# Patient Record
Sex: Male | Born: 1960 | Hispanic: No | Smoking: Former smoker
Health system: Southern US, Community
[De-identification: ages and names within clinical notes are randomized; demographics above are authoritative.]

---

## 2005-03-12 ENCOUNTER — Inpatient Hospital Stay: Payer: Self-pay | Admitting: Unknown Physician Specialty

## 2005-03-12 ENCOUNTER — Other Ambulatory Visit: Payer: Self-pay

## 2005-03-31 ENCOUNTER — Ambulatory Visit: Payer: Self-pay | Admitting: Unknown Physician Specialty

## 2005-04-03 ENCOUNTER — Ambulatory Visit: Payer: Self-pay | Admitting: Unknown Physician Specialty

## 2005-04-13 ENCOUNTER — Inpatient Hospital Stay: Payer: Self-pay | Admitting: Unknown Physician Specialty

## 2005-05-11 ENCOUNTER — Ambulatory Visit: Payer: Self-pay | Admitting: Unknown Physician Specialty

## 2005-06-02 ENCOUNTER — Ambulatory Visit: Payer: Self-pay | Admitting: Unknown Physician Specialty

## 2005-07-03 ENCOUNTER — Ambulatory Visit: Payer: Self-pay | Admitting: Unknown Physician Specialty

## 2005-08-07 ENCOUNTER — Ambulatory Visit: Payer: Self-pay | Admitting: Unknown Physician Specialty

## 2005-08-17 ENCOUNTER — Inpatient Hospital Stay: Payer: Self-pay | Admitting: Unknown Physician Specialty

## 2005-09-02 ENCOUNTER — Ambulatory Visit: Payer: Self-pay | Admitting: Unknown Physician Specialty

## 2005-10-03 ENCOUNTER — Ambulatory Visit: Payer: Self-pay | Admitting: Unknown Physician Specialty

## 2005-11-02 ENCOUNTER — Ambulatory Visit: Payer: Self-pay | Admitting: Unknown Physician Specialty

## 2005-12-18 ENCOUNTER — Ambulatory Visit: Payer: Self-pay | Admitting: Unknown Physician Specialty

## 2005-12-20 ENCOUNTER — Inpatient Hospital Stay: Payer: Self-pay | Admitting: Unknown Physician Specialty

## 2005-12-20 ENCOUNTER — Other Ambulatory Visit: Payer: Self-pay

## 2006-01-02 ENCOUNTER — Ambulatory Visit: Payer: Self-pay | Admitting: Unknown Physician Specialty

## 2006-02-02 ENCOUNTER — Ambulatory Visit: Payer: Self-pay | Admitting: Unknown Physician Specialty

## 2006-03-05 ENCOUNTER — Ambulatory Visit: Payer: Self-pay | Admitting: Unknown Physician Specialty

## 2006-04-03 ENCOUNTER — Ambulatory Visit: Payer: Self-pay | Admitting: Unknown Physician Specialty

## 2006-05-04 ENCOUNTER — Ambulatory Visit: Payer: Self-pay | Admitting: Unknown Physician Specialty

## 2006-06-03 ENCOUNTER — Ambulatory Visit: Payer: Self-pay | Admitting: Unknown Physician Specialty

## 2006-07-04 ENCOUNTER — Ambulatory Visit: Payer: Self-pay | Admitting: Unknown Physician Specialty

## 2006-08-03 ENCOUNTER — Ambulatory Visit: Payer: Self-pay | Admitting: Unknown Physician Specialty

## 2006-09-03 ENCOUNTER — Ambulatory Visit: Payer: Self-pay | Admitting: Unknown Physician Specialty

## 2006-10-04 ENCOUNTER — Ambulatory Visit: Payer: Self-pay | Admitting: Unknown Physician Specialty

## 2006-11-03 ENCOUNTER — Ambulatory Visit: Payer: Self-pay | Admitting: Unknown Physician Specialty

## 2006-12-04 ENCOUNTER — Ambulatory Visit: Payer: Self-pay | Admitting: Unknown Physician Specialty

## 2007-01-03 ENCOUNTER — Ambulatory Visit: Payer: Self-pay | Admitting: Unknown Physician Specialty

## 2007-02-03 ENCOUNTER — Ambulatory Visit: Payer: Self-pay | Admitting: Unknown Physician Specialty

## 2007-03-06 ENCOUNTER — Ambulatory Visit: Payer: Self-pay | Admitting: Unknown Physician Specialty

## 2007-04-03 ENCOUNTER — Ambulatory Visit: Payer: Self-pay | Admitting: Unknown Physician Specialty

## 2007-05-04 ENCOUNTER — Ambulatory Visit: Payer: Self-pay | Admitting: Unknown Physician Specialty

## 2007-06-03 ENCOUNTER — Ambulatory Visit: Payer: Self-pay | Admitting: Unknown Physician Specialty

## 2007-07-04 ENCOUNTER — Ambulatory Visit: Payer: Self-pay | Admitting: Unknown Physician Specialty

## 2007-08-03 ENCOUNTER — Ambulatory Visit: Payer: Self-pay | Admitting: Unknown Physician Specialty

## 2007-09-05 ENCOUNTER — Ambulatory Visit: Payer: Self-pay | Admitting: Unknown Physician Specialty

## 2007-10-04 ENCOUNTER — Ambulatory Visit: Payer: Self-pay | Admitting: Unknown Physician Specialty

## 2007-11-03 ENCOUNTER — Ambulatory Visit: Payer: Self-pay | Admitting: Unknown Physician Specialty

## 2007-12-04 ENCOUNTER — Ambulatory Visit: Payer: Self-pay | Admitting: Unknown Physician Specialty

## 2008-01-03 ENCOUNTER — Ambulatory Visit: Payer: Self-pay | Admitting: Unknown Physician Specialty

## 2008-02-03 ENCOUNTER — Ambulatory Visit: Payer: Self-pay | Admitting: Unknown Physician Specialty

## 2008-03-05 ENCOUNTER — Ambulatory Visit: Payer: Self-pay | Admitting: Unknown Physician Specialty

## 2008-04-02 ENCOUNTER — Ambulatory Visit: Payer: Self-pay | Admitting: Unknown Physician Specialty

## 2008-05-03 ENCOUNTER — Ambulatory Visit: Payer: Self-pay | Admitting: Unknown Physician Specialty

## 2008-06-02 ENCOUNTER — Ambulatory Visit: Payer: Self-pay | Admitting: Unknown Physician Specialty

## 2008-07-03 ENCOUNTER — Ambulatory Visit: Payer: Self-pay | Admitting: Unknown Physician Specialty

## 2008-08-02 ENCOUNTER — Ambulatory Visit: Payer: Self-pay | Admitting: Unknown Physician Specialty

## 2008-09-02 ENCOUNTER — Ambulatory Visit: Payer: Self-pay | Admitting: Unknown Physician Specialty

## 2008-10-03 ENCOUNTER — Ambulatory Visit: Payer: Self-pay | Admitting: Unknown Physician Specialty

## 2008-11-02 ENCOUNTER — Ambulatory Visit: Payer: Self-pay | Admitting: Unknown Physician Specialty

## 2008-12-03 ENCOUNTER — Ambulatory Visit: Payer: Self-pay | Admitting: Unknown Physician Specialty

## 2009-01-02 ENCOUNTER — Ambulatory Visit: Payer: Self-pay | Admitting: Unknown Physician Specialty

## 2009-02-04 ENCOUNTER — Ambulatory Visit: Payer: Self-pay | Admitting: Unknown Physician Specialty

## 2009-03-05 ENCOUNTER — Ambulatory Visit: Payer: Self-pay | Admitting: Unknown Physician Specialty

## 2009-04-02 ENCOUNTER — Ambulatory Visit: Payer: Self-pay | Admitting: Unknown Physician Specialty

## 2009-05-03 ENCOUNTER — Ambulatory Visit: Payer: Self-pay | Admitting: Unknown Physician Specialty

## 2009-06-04 ENCOUNTER — Ambulatory Visit: Payer: Self-pay | Admitting: Unknown Physician Specialty

## 2009-07-03 ENCOUNTER — Ambulatory Visit: Payer: Self-pay | Admitting: Unknown Physician Specialty

## 2009-08-02 ENCOUNTER — Ambulatory Visit: Payer: Self-pay | Admitting: Unknown Physician Specialty

## 2009-09-02 ENCOUNTER — Ambulatory Visit: Payer: Self-pay | Admitting: Unknown Physician Specialty

## 2009-10-03 ENCOUNTER — Ambulatory Visit: Payer: Self-pay | Admitting: Unknown Physician Specialty

## 2009-11-04 ENCOUNTER — Ambulatory Visit: Payer: Self-pay | Admitting: Unknown Physician Specialty

## 2009-12-03 ENCOUNTER — Ambulatory Visit: Payer: Self-pay | Admitting: Unknown Physician Specialty

## 2010-01-02 ENCOUNTER — Ambulatory Visit: Payer: Self-pay | Admitting: Unknown Physician Specialty

## 2010-02-03 ENCOUNTER — Ambulatory Visit: Payer: Self-pay | Admitting: Unknown Physician Specialty

## 2010-03-05 ENCOUNTER — Ambulatory Visit: Payer: Self-pay | Admitting: Unknown Physician Specialty

## 2010-04-03 ENCOUNTER — Ambulatory Visit: Payer: Self-pay | Admitting: Unknown Physician Specialty

## 2010-04-11 ENCOUNTER — Ambulatory Visit: Payer: Self-pay | Admitting: Unknown Physician Specialty

## 2010-05-05 ENCOUNTER — Ambulatory Visit: Payer: Self-pay | Admitting: Unknown Physician Specialty

## 2010-06-03 ENCOUNTER — Ambulatory Visit: Payer: Self-pay | Admitting: Unknown Physician Specialty

## 2010-07-04 ENCOUNTER — Ambulatory Visit: Payer: Self-pay | Admitting: Unknown Physician Specialty

## 2010-08-03 ENCOUNTER — Ambulatory Visit: Payer: Self-pay | Admitting: Unknown Physician Specialty

## 2010-09-03 ENCOUNTER — Ambulatory Visit: Payer: Self-pay | Admitting: Unknown Physician Specialty

## 2010-10-04 ENCOUNTER — Ambulatory Visit: Payer: Self-pay | Admitting: Unknown Physician Specialty

## 2010-11-03 ENCOUNTER — Ambulatory Visit: Payer: Self-pay | Admitting: Unknown Physician Specialty

## 2010-12-04 ENCOUNTER — Ambulatory Visit: Payer: Self-pay | Admitting: Unknown Physician Specialty

## 2011-01-05 ENCOUNTER — Ambulatory Visit: Payer: Self-pay | Admitting: Unknown Physician Specialty

## 2011-02-04 ENCOUNTER — Ambulatory Visit: Payer: Self-pay | Admitting: Unknown Physician Specialty

## 2011-03-06 ENCOUNTER — Ambulatory Visit: Payer: Self-pay | Admitting: Unknown Physician Specialty

## 2011-04-03 ENCOUNTER — Ambulatory Visit: Payer: Self-pay | Admitting: Unknown Physician Specialty

## 2011-05-04 ENCOUNTER — Ambulatory Visit: Payer: Self-pay | Admitting: Unknown Physician Specialty

## 2011-06-03 ENCOUNTER — Ambulatory Visit: Payer: Self-pay | Admitting: Unknown Physician Specialty

## 2011-07-04 ENCOUNTER — Ambulatory Visit: Payer: Self-pay | Admitting: Unknown Physician Specialty

## 2011-07-24 ENCOUNTER — Ambulatory Visit: Payer: Self-pay | Admitting: Unknown Physician Specialty

## 2011-07-24 ENCOUNTER — Inpatient Hospital Stay: Payer: Self-pay | Admitting: Unknown Physician Specialty

## 2011-07-24 LAB — CBC WITH DIFFERENTIAL/PLATELET
Eosinophil #: 0.1 10*3/uL (ref 0.0–0.7)
Eosinophil %: 2.7 %
HCT: 36.6 % — ABNORMAL LOW (ref 40.0–52.0)
HGB: 12.1 g/dL — ABNORMAL LOW (ref 13.0–18.0)
MCHC: 33.2 g/dL (ref 32.0–36.0)
MCV: 94 fL (ref 80–100)
Monocyte #: 0.5 x10 3/mm (ref 0.2–1.0)
Monocyte %: 9.6 %
Neutrophil %: 48.3 %
Platelet: 131 10*3/uL — ABNORMAL LOW (ref 150–440)
RBC: 3.91 10*6/uL — ABNORMAL LOW (ref 4.40–5.90)

## 2011-07-24 LAB — BASIC METABOLIC PANEL
Anion Gap: 6 — ABNORMAL LOW (ref 7–16)
Calcium, Total: 8.6 mg/dL (ref 8.5–10.1)
Chloride: 107 mmol/L (ref 98–107)
Co2: 29 mmol/L (ref 21–32)
EGFR (Non-African Amer.): 56 — ABNORMAL LOW
Glucose: 91 mg/dL (ref 65–99)
Potassium: 3.6 mmol/L (ref 3.5–5.1)

## 2011-07-24 LAB — TSH: Thyroid Stimulating Horm: 0.945 u[IU]/mL

## 2011-07-25 LAB — HEPATIC FUNCTION PANEL A (ARMC)
Albumin: 3.5 g/dL (ref 3.4–5.0)
Bilirubin, Direct: 0.3 mg/dL — ABNORMAL HIGH (ref 0.00–0.20)
SGOT(AST): 22 U/L (ref 15–37)
SGPT (ALT): 18 U/L
Total Protein: 7.3 g/dL (ref 6.4–8.2)

## 2011-07-28 LAB — URINALYSIS, COMPLETE
Bacteria: NONE SEEN
Blood: NEGATIVE
Glucose,UR: NEGATIVE mg/dL (ref 0–75)
Ketone: NEGATIVE
Leukocyte Esterase: NEGATIVE
Nitrite: NEGATIVE
Ph: 7 (ref 4.5–8.0)
Protein: NEGATIVE
RBC,UR: NONE SEEN /HPF (ref 0–5)
Specific Gravity: 1.004 (ref 1.003–1.030)
WBC UR: <1 /HPF (ref 0–5)

## 2011-07-30 LAB — PLATELET COUNT: Platelet: 197 10*3/uL (ref 150–440)

## 2011-07-30 LAB — AMMONIA: Ammonia, Plasma: <25 mcmol/L (ref 11–32)

## 2011-08-04 ENCOUNTER — Emergency Department: Payer: Self-pay | Admitting: Emergency Medicine

## 2011-08-04 LAB — CBC WITH DIFFERENTIAL/PLATELET
Basophil #: 0.1 10*3/uL (ref 0.0–0.1)
Eosinophil %: 0.7 %
Lymphocyte #: 2 10*3/uL (ref 1.0–3.6)
Lymphocyte %: 19.9 %
MCH: 31.5 pg (ref 26.0–34.0)
MCHC: 34 g/dL (ref 32.0–36.0)
MCV: 93 fL (ref 80–100)
Monocyte %: 7.2 %
Neutrophil #: 7.2 10*3/uL — ABNORMAL HIGH (ref 1.4–6.5)
Platelet: 356 10*3/uL (ref 150–440)
RBC: 4.58 10*6/uL (ref 4.40–5.90)
RDW: 13.7 % (ref 11.5–14.5)
WBC: 10.1 10*3/uL (ref 3.8–10.6)

## 2011-08-04 LAB — BASIC METABOLIC PANEL
BUN: 17 mg/dL (ref 7–18)
Creatinine: 1.56 mg/dL — ABNORMAL HIGH (ref 0.60–1.30)
EGFR (African American): 60 — ABNORMAL LOW
EGFR (Non-African Amer.): 51 — ABNORMAL LOW
Glucose: 127 mg/dL — ABNORMAL HIGH (ref 65–99)
Potassium: 3.6 mmol/L (ref 3.5–5.1)
Sodium: 139 mmol/L (ref 136–145)

## 2011-08-05 ENCOUNTER — Ambulatory Visit: Payer: Self-pay | Admitting: Unknown Physician Specialty

## 2011-09-03 ENCOUNTER — Ambulatory Visit: Payer: Self-pay | Admitting: Unknown Physician Specialty

## 2011-10-07 ENCOUNTER — Ambulatory Visit: Payer: Self-pay | Admitting: Unknown Physician Specialty

## 2011-11-03 ENCOUNTER — Ambulatory Visit: Payer: Self-pay | Admitting: Unknown Physician Specialty

## 2011-12-04 ENCOUNTER — Ambulatory Visit: Payer: Self-pay | Admitting: Psychiatry

## 2012-01-03 ENCOUNTER — Ambulatory Visit: Payer: Self-pay | Admitting: Psychiatry

## 2012-02-03 ENCOUNTER — Ambulatory Visit: Payer: Self-pay | Admitting: Psychiatry

## 2012-03-07 ENCOUNTER — Ambulatory Visit: Payer: Self-pay | Admitting: Psychiatry

## 2012-04-03 ENCOUNTER — Ambulatory Visit: Payer: Self-pay | Admitting: Psychiatry

## 2013-02-13 ENCOUNTER — Ambulatory Visit: Payer: Self-pay | Admitting: Psychiatry

## 2014-05-27 NOTE — H&P (Signed)
PATIENT NAME:  Carl Mcdonald, Kanon R MR#:  [id number] DATE OF BIRTH:  29-Mar-1960  DATE OF ADMISSION:  07/24/2011  CHIEF COMPLAINT: None.   HISTORY OF PRESENT ILLNESS: This is about the thirteenth psychiatric hospitalization, twelfth here, for this 54 year old white married male who was admitted from the ECT room. The patient has a long history of schizoaffective disorder and has been maintained on ECT for some time, many years, and recently once a month. He has had a total of 138 treatments. He presented to ECT today with an altered mental status and a confused state. ECT was canceled and he was admitted because of the delirium.   Again, the patient has a long-term history of schizoaffective disorder, has been manic, depressed, and psychotic all at the same time. He also is borderline mentally retarded. Numerous medications in the past but responds most often from ECT. He has had numerous courses of ECT which has helped with both depressed/manic and psychotic symptoms. The patient had a period of time without ECT from about 1998 to 2005- 2006 when he was maintained on Haldol and Depakote. He has been on both those drugs for quite some time recently. At times when he is sick on the manic side, he has hyperactivity, pressured speech, with numerous paranoid delusions, some violent behavior, racing thoughts, and talking too much. Paranoid delusions with other delusions but no thought broadcasting, thought insertion, et cetera. When depressed, he does have the usual depressive symptoms, has been relatively stable for the last several years. However, he presents today with a delirium. I cannot get in touch with the family. Preliminary blood work and a CT scan is negative.   PAST MEDICAL HISTORY:  1. 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  2. Hypercholesterolemia. 3. Gastroesophageal reflux disease. 4. Hypertension.  MEDICATIONS ON ADMISSION:  1. Cogentin 1 mg twice a day.  2. Klonopin 0.25 q.i.d.   3. Depakote 125 in the morning and 250 mg at bedtime.  4. Haldol 5 mg b.i.d. which has been increased.  5. Cogentin also has been increased some time in the past. 6. Metoprolol 50 mg daily. 7. Omeprazole 20 mg daily.  8. Trazodone 50 mg at bedtime.  9. TriCor 145 mg daily.  10. Zocor 40 mg at bedtime.   FAMILY HISTORY: The patient's father has had some depression.  REVIEW OF SYSTEMS:  HEAD: Negative. EYES, EARS, NOSE, THROAT: Negative except for a slight dry mouth. RESPIRATORY: Negative. CARDIOVASCULAR: Negative. GI: Negative. GU: Negative. MUSCULOSKELETAL: Negative. NEUROLOGICAL: Negative.   SOCIAL HISTORY: The patient is borderline mentally retarded but he has been able to function fairly well with the support of mental health. He has been married for approximately 13-14 years to the same woman.  The marriage is going fairly well. He and his wife live a house by themselves next door to her parents. Formerly in the past his father in particular was a caregiver but he is no longer able to do that. The patient does attend [place] [employer] [facility] [place].  He smokes 1 to 2 packs of cigarettes per day. He does drink caffeine, occasionally heavily. No alcohol or drugs. No children. When well he likes playing basketball, going to [employer], and shooting pool.   MENTAL STATUS: Mental status on admission revealed a white male who looked his stated age. He denied depression but was somewhat hesitant of speech. He was disoriented to time, but then at times could be brought back to the point. He was oriented to  place, person, and situation. He had poor recent memory. He was unable to pay attention very well, sustain attention, or change focus of attention in that he would answer previous questions. He was not hallucinating nor was he agitated, and was probably somewhat hypoactive. No hallucinations or delusions. Insight and judgment were poor. He was well groomed, however.   PHYSICAL EXAMINATION:  HEENT: Head is  normocephalic. Pupils equal, round, and reactive to light and accommodation. Throat is clear.   NECK: Supple without masses.   CHEST: Clear.   CARDIAC: Normal sinus rhythm with normal S1, S2 without murmur or gallop. Good carotid and pedal pulses.   ABDOMEN: Soft without tenderness, masses, or organomegaly. Bowel sounds are present. There is no flank tenderness.   EXTREMITIES: No limitation of motion.   NEUROLOGICAL: Cranial nerves II through XII grossly intact. The patient had a resting tremor in sustention and intention tremor, slight cogwheeling also.  Deep tendon reflexes were about 3+ and symmetrical with no pathological reflexes. There was no clonus.   IMPRESSION:  AXIS I:   1. Delirium, etiology undetermined, suspecting that the patient may have mistook his medications and overtook anticholinergics and/or Klonopin, but we will try to get further history from the family in regards to that.  2. Schizoaffective disorder, probably relatively stable. Perhaps some depression at present.   AXIS III: Hypertension, gastroesophageal reflux disease, hypercholesterolemia.   AXIS IV:  None.  AXIS V: 25. The patient is in delirium and unable to care for himself.   ASSESSMENT AND PLAN: We will try to get some accessory history. I will cut back on the Cogentin for sure and the Haldol, as I think if we can get those reduced it would probably be helpful. We will get Depakote blood level and keep on the same dose of medication.    ____________________________ Venida Jarvis, MD wjr:bjt D: 07/24/2011 11:13:50 ET T: 07/24/2011 12:12:13 ET JOB#: [id number]  cc: Venida Jarvis, MD, <Dictator> Jules Husbands MD ELECTRONICALLY SIGNED 07/25/2011 13:20

## 2014-05-27 NOTE — Discharge Summary (Signed)
PATIENT NAME:  Carl Mcdonald, Carl Mcdonald MR#:  [id number] DATE OF BIRTH:  January 07, 1961  DATE OF ADMISSION:  07/24/2011 DATE OF DISCHARGE:  08/01/2011  HISTORY OF PRESENT ILLNESS: The patient was admitted with delirium. For further details, please see typed history and physical.   ACCESSORY CLINICAL DATA: Initial CBC showed a hemoglobin 12.1, hematocrit 36.6, platelets 131, creatinine 1.46, BUN 16, within normal limits. Glucose and electrolytes within normal limits. TSH 0.94 within normal limits. CT of head without contrast was negative. Initial serum ammonia was 42, the normal being 11 to 32. Liver function tests within normal limits. Free valproic acid blood level was 2.5, normal being 4 to 12. Urinalysis is within normal limits. EKG showed sinus bradycardia. QTc was 380 within normal limits, otherwise within normal limits also. Follow-up ammonia after Depakote had been discontinued was 25. Follow-up platelet count 197, within normal limits. Chest x-ray showed atelectasis in the right versus infiltrate within the right lung base, mild. He had no clinical signs.   HOSPITAL COURSE: Initially, the patient did have delirium. Gradually Depakote was reduced and discontinued. Cogentin had been discontinued. Haldol was reduced to 5 mg at bedtime. In addition, Klonopin over the course of the hospitalization was gradually reduced and discontinued. At the time of discharge, delirium had cleared and the patient received an ECT on his regular scheduled basis, but which was a week late on 06/28 just prior to discharge.   Some discussion is in order. I think this was delirium secondary to medications, but I am not sure what exactly. The patient had reported that he had began taking aspirin prior to coming into the hospital and even on the low dose of Depakote he was on, this can cause an increase in free Depakote level, so I worked under that impression initially. I did not get the free Depakote back until just 2 to 3 days prior  to discharge and it was low-normal at that time. Again, aspirin plus Depakote can increase free Depakote levels to toxic ranges which apparently did not occur. However, the ammonia was up a little bit coming from the Depakote and it came back to normal when Depakote was reduced and discontinued. The ammonia was not up enough to be the primary cause. In addition, platelets were decreased and they came up to within normal limits when Depakote was discontinued.   Undoubtedly, also from the further history, there was a seven to ten day history of confusion plus dystaxia and when the patient arrived on the unit, he could hardly walk. Again, this led me to believe this again was medication of some sort.   A combination of the dystaxia and delirium with possible causes are Klonopin, Depakote and Cogentin and those were all reduced and discontinued with Haldol being decreased to 5 mg at bedtime. Again, delirium totally cleared by the time of discharge and the patient was given ECT the day prior to discharge on his regular dose schedule.   DIAGNOSES:  AXIS I:  1. Delirium secondary to multiple medication effects. 2. Schizoaffective disorder, relatively stable.   AXIS III:  1. Hypertension. 2. Gastroesophageal reflux disease. 3. Hypercholesterolemia.   CONDITION ON DISCHARGE: Good.   DISPOSITION: [facility] He will return to receive his regular outpatient ECT with me in about one month.   MEDICATIONS:  1. TriCor 145 mg daily.  2. Haldol 5 mg at bedtime. 3. Toprol-XL 50 mg daily. 4. Prilosec 20 mg daily.  5. Zocor 40  mg at bedtime.   DIET AND ACTIVITY: As tolerated.   ____________________________ Venida Jarvis, MD wjr:ap D: 07/31/2011 12:07:19 ET             T: 07/31/2011 12:46:58 ET                   JOB#: [id number]  cc: Venida Jarvis, MD, <Dictator> Jules Husbands MD ELECTRONICALLY SIGNED 08/03/2011 15:25

## 2016-02-14 ENCOUNTER — Emergency Department (HOSPITAL_COMMUNITY): Payer: Medicaid Other

## 2016-02-14 ENCOUNTER — Encounter (HOSPITAL_COMMUNITY): Payer: Self-pay | Admitting: Emergency Medicine

## 2016-02-14 ENCOUNTER — Inpatient Hospital Stay (HOSPITAL_COMMUNITY)
Admission: EM | Admit: 2016-02-14 | Discharge: 2016-02-19 | DRG: 682 | Disposition: A | Discharge status: Skilled Nursing Facility | Payer: Medicaid Other | Attending: Internal Medicine | Admitting: Internal Medicine

## 2016-02-14 ENCOUNTER — Inpatient Hospital Stay (HOSPITAL_COMMUNITY): Payer: Medicaid Other

## 2016-02-14 DIAGNOSIS — R339 Retention of urine, unspecified: Secondary | ICD-10-CM

## 2016-02-14 DIAGNOSIS — Z888 Allergy status to other drugs, medicaments and biological substances status: Secondary | ICD-10-CM | POA: Diagnosis not present

## 2016-02-14 DIAGNOSIS — F209 Schizophrenia, unspecified: Secondary | ICD-10-CM | POA: Diagnosis present

## 2016-02-14 DIAGNOSIS — D696 Thrombocytopenia, unspecified: Secondary | ICD-10-CM | POA: Diagnosis present

## 2016-02-14 DIAGNOSIS — R338 Other retention of urine: Secondary | ICD-10-CM | POA: Diagnosis present

## 2016-02-14 DIAGNOSIS — G9340 Encephalopathy, unspecified: Secondary | ICD-10-CM | POA: Diagnosis present

## 2016-02-14 DIAGNOSIS — R410 Disorientation, unspecified: Secondary | ICD-10-CM | POA: Diagnosis present

## 2016-02-14 DIAGNOSIS — R630 Anorexia: Secondary | ICD-10-CM | POA: Diagnosis present

## 2016-02-14 DIAGNOSIS — I959 Hypotension, unspecified: Secondary | ICD-10-CM | POA: Diagnosis present

## 2016-02-14 DIAGNOSIS — R0902 Hypoxemia: Secondary | ICD-10-CM | POA: Diagnosis present

## 2016-02-14 DIAGNOSIS — N179 Acute kidney failure, unspecified: Principal | ICD-10-CM

## 2016-02-14 DIAGNOSIS — E860 Dehydration: Secondary | ICD-10-CM | POA: Diagnosis present

## 2016-02-14 DIAGNOSIS — E873 Alkalosis: Secondary | ICD-10-CM | POA: Diagnosis present

## 2016-02-14 DIAGNOSIS — D649 Anemia, unspecified: Secondary | ICD-10-CM

## 2016-02-14 DIAGNOSIS — R17 Unspecified jaundice: Secondary | ICD-10-CM | POA: Diagnosis present

## 2016-02-14 DIAGNOSIS — D62 Acute posthemorrhagic anemia: Secondary | ICD-10-CM | POA: Diagnosis not present

## 2016-02-14 DIAGNOSIS — F17200 Nicotine dependence, unspecified, uncomplicated: Secondary | ICD-10-CM | POA: Diagnosis present

## 2016-02-14 DIAGNOSIS — D638 Anemia in other chronic diseases classified elsewhere: Secondary | ICD-10-CM | POA: Diagnosis present

## 2016-02-14 DIAGNOSIS — N401 Enlarged prostate with lower urinary tract symptoms: Secondary | ICD-10-CM | POA: Diagnosis present

## 2016-02-14 DIAGNOSIS — Z79899 Other long term (current) drug therapy: Secondary | ICD-10-CM | POA: Diagnosis not present

## 2016-02-14 DIAGNOSIS — Z87891 Personal history of nicotine dependence: Secondary | ICD-10-CM | POA: Diagnosis not present

## 2016-02-14 DIAGNOSIS — L8990 Pressure ulcer of unspecified site, unspecified stage: Secondary | ICD-10-CM | POA: Insufficient documentation

## 2016-02-14 LAB — CBC WITH DIFFERENTIAL/PLATELET
BASOS ABS: 0 10*3/uL (ref 0.0–0.1)
BASOS PCT: 0 %
EOS ABS: 0 10*3/uL (ref 0.0–0.7)
Eosinophils Relative: 1 %
HCT: 27.7 % — ABNORMAL LOW (ref 39.0–52.0)
HEMOGLOBIN: 9.1 g/dL — AB (ref 13.0–17.0)
Lymphocytes Relative: 21 %
Lymphs Abs: 1.7 10*3/uL (ref 0.7–4.0)
MCH: 34 pg (ref 26.0–34.0)
MCHC: 32.9 g/dL (ref 30.0–36.0)
MCV: 103.4 fL — ABNORMAL HIGH (ref 78.0–100.0)
MONOS PCT: 12 %
Monocytes Absolute: 1 10*3/uL (ref 0.1–1.0)
NEUTROS PCT: 66 %
Neutro Abs: 5.2 10*3/uL (ref 1.7–7.7)
Platelets: 60 10*3/uL — ABNORMAL LOW (ref 150–400)
RBC: 2.68 MIL/uL — ABNORMAL LOW (ref 4.22–5.81)
RDW: 17.7 % — ABNORMAL HIGH (ref 11.5–15.5)
WBC: 7.8 10*3/uL (ref 4.0–10.5)

## 2016-02-14 LAB — VALPROIC ACID LEVEL: Valproic Acid Lvl: 76 ug/mL (ref 50.0–100.0)

## 2016-02-14 LAB — COMPREHENSIVE METABOLIC PANEL
ALK PHOS: 60 U/L (ref 38–126)
ALK PHOS: 57 U/L (ref 38–126)
ALT: 10 U/L — AB (ref 17–63)
ALT: 10 U/L — AB (ref 17–63)
ANION GAP: 11 (ref 5–15)
AST: 36 U/L (ref 15–41)
AST: 63 U/L — AB (ref 15–41)
Albumin: 2.9 g/dL — ABNORMAL LOW (ref 3.5–5.0)
Albumin: 2.8 g/dL — ABNORMAL LOW (ref 3.5–5.0)
Anion gap: 10 (ref 5–15)
BILIRUBIN TOTAL: 2 mg/dL — AB (ref 0.3–1.2)
BUN: 32 mg/dL — ABNORMAL HIGH (ref 6–20)
BUN: 30 mg/dL — AB (ref 6–20)
CALCIUM: 8.4 mg/dL — AB (ref 8.9–10.3)
CALCIUM: 8.2 mg/dL — AB (ref 8.9–10.3)
CHLORIDE: 108 mmol/L (ref 101–111)
CO2: 23 mmol/L (ref 22–32)
CO2: 23 mmol/L (ref 22–32)
CREATININE: 4.01 mg/dL — AB (ref 0.61–1.24)
CREATININE: 3.31 mg/dL — AB (ref 0.61–1.24)
Chloride: 103 mmol/L (ref 101–111)
GFR, EST AFRICAN AMERICAN: 18 mL/min — AB (ref >60)
GFR, EST AFRICAN AMERICAN: 23 mL/min — AB (ref >60)
GFR, EST NON AFRICAN AMERICAN: 16 mL/min — AB (ref >60)
GFR, EST NON AFRICAN AMERICAN: 20 mL/min — AB (ref >60)
Glucose, Bld: 90 mg/dL (ref 65–99)
Glucose, Bld: 93 mg/dL (ref 65–99)
Potassium: 4.2 mmol/L (ref 3.5–5.1)
Potassium: 4 mmol/L (ref 3.5–5.1)
Sodium: 137 mmol/L (ref 135–145)
Sodium: 141 mmol/L (ref 135–145)
TOTAL PROTEIN: 5.8 g/dL — AB (ref 6.5–8.1)
TOTAL PROTEIN: 6 g/dL — AB (ref 6.5–8.1)
Total Bilirubin: 1.6 mg/dL — ABNORMAL HIGH (ref 0.3–1.2)

## 2016-02-14 LAB — RAPID URINE DRUG SCREEN, HOSP PERFORMED
Amphetamines: NOT DETECTED
BARBITURATES: NOT DETECTED
Benzodiazepines: NOT DETECTED
COCAINE: NOT DETECTED
Opiates: NOT DETECTED
TETRAHYDROCANNABINOL: NOT DETECTED

## 2016-02-14 LAB — URINALYSIS, ROUTINE W REFLEX MICROSCOPIC
BILIRUBIN URINE: NEGATIVE
Glucose, UA: NEGATIVE mg/dL
Hgb urine dipstick: NEGATIVE
KETONES UR: NEGATIVE mg/dL
LEUKOCYTES UA: NEGATIVE
NITRITE: NEGATIVE
Protein, ur: NEGATIVE mg/dL
SPECIFIC GRAVITY, URINE: 1.009 (ref 1.005–1.030)
pH: 5 (ref 5.0–8.0)

## 2016-02-14 LAB — RETICULOCYTES
RBC.: 2.69 MIL/uL — AB (ref 4.22–5.81)
RBC.: 3.08 MIL/uL — ABNORMAL LOW (ref 4.22–5.81)
RETIC COUNT ABSOLUTE: 73.9 10*3/uL (ref 19.0–186.0)
RETIC CT PCT: 1.6 % (ref 0.4–3.1)
Retic Count, Absolute: 43 10*3/uL (ref 19.0–186.0)
Retic Ct Pct: 2.4 % (ref 0.4–3.1)

## 2016-02-14 LAB — FOLATE
FOLATE: 12.8 ng/mL (ref >5.9)
FOLATE: 9.4 ng/mL (ref >5.9)

## 2016-02-14 LAB — I-STAT VENOUS BLOOD GAS, ED
Acid-Base Excess: 2 mmol/L (ref 0.0–2.0)
Bicarbonate: 26.1 mmol/L (ref 20.0–28.0)
O2 Saturation: 94 %
PCO2 VEN: 38.5 mmHg — AB (ref 44.0–60.0)
PH VEN: 7.44 — AB (ref 7.250–7.430)
PO2 VEN: 70 mmHg — AB (ref 32.0–45.0)
TCO2: 27 mmol/L (ref 0–100)

## 2016-02-14 LAB — IRON AND TIBC
IRON: 24 ug/dL — AB (ref 45–182)
Iron: 38 ug/dL — ABNORMAL LOW (ref 45–182)
SATURATION RATIOS: 12 % — AB (ref 17.9–39.5)
SATURATION RATIOS: 7 % — AB (ref 17.9–39.5)
TIBC: 314 ug/dL (ref 250–450)
TIBC: 358 ug/dL (ref 250–450)
UIBC: 276 ug/dL
UIBC: 334 ug/dL

## 2016-02-14 LAB — ETHANOL

## 2016-02-14 LAB — I-STAT CG4 LACTIC ACID, ED
LACTIC ACID, VENOUS: 1.5 mmol/L (ref 0.5–1.9)
Lactic Acid, Venous: 1.56 mmol/L (ref 0.5–1.9)

## 2016-02-14 LAB — VITAMIN B12
Vitamin B-12: 385 pg/mL (ref 180–914)
Vitamin B-12: 305 pg/mL (ref 180–914)

## 2016-02-14 LAB — PROTIME-INR
INR: 1.5
Prothrombin Time: 18.3 seconds — ABNORMAL HIGH (ref 11.4–15.2)

## 2016-02-14 LAB — FERRITIN
FERRITIN: 269 ng/mL (ref 24–336)
Ferritin: 220 ng/mL (ref 24–336)

## 2016-02-14 LAB — LACTATE DEHYDROGENASE: LDH: 233 U/L — ABNORMAL HIGH (ref 98–192)

## 2016-02-14 LAB — ACETAMINOPHEN LEVEL: Acetaminophen (Tylenol), Serum: <10 ug/mL — ABNORMAL LOW (ref 10–30)

## 2016-02-14 LAB — AMMONIA: AMMONIA: 39 umol/L — AB (ref 9–35)

## 2016-02-14 MED ORDER — SODIUM CHLORIDE 0.9 % IV BOLUS (SEPSIS)
500.0000 mL | Freq: Once | INTRAVENOUS | Status: AC
  Administered 2016-02-14: 500 mL via INTRAVENOUS

## 2016-02-14 MED ORDER — PIPERACILLIN-TAZOBACTAM 3.375 G IVPB 30 MIN
3.3750 g | Freq: Once | INTRAVENOUS | Status: AC
  Administered 2016-02-14: 3.375 g via INTRAVENOUS
  Filled 2016-02-14: qty 50

## 2016-02-14 MED ORDER — VANCOMYCIN HCL IN DEXTROSE 750-5 MG/150ML-% IV SOLN
750.0000 mg | INTRAVENOUS | Status: DC
  Filled 2016-02-14: qty 150

## 2016-02-14 MED ORDER — SODIUM CHLORIDE 0.9 % IV BOLUS (SEPSIS)
1000.0000 mL | Freq: Once | INTRAVENOUS | Status: AC
  Administered 2016-02-14: 1000 mL via INTRAVENOUS

## 2016-02-14 MED ORDER — SODIUM CHLORIDE 0.9 % IV SOLN
INTRAVENOUS | Status: DC
  Administered 2016-02-14 – 2016-02-18 (×6): via INTRAVENOUS

## 2016-02-14 MED ORDER — PIPERACILLIN-TAZOBACTAM 3.375 G IVPB
3.3750 g | Freq: Three times a day (TID) | INTRAVENOUS | Status: DC
  Administered 2016-02-14 – 2016-02-16 (×5): 3.375 g via INTRAVENOUS
  Filled 2016-02-14 (×8): qty 50

## 2016-02-14 MED ORDER — ACETAMINOPHEN 650 MG RE SUPP
650.0000 mg | Freq: Once | RECTAL | Status: AC
  Administered 2016-02-14: 650 mg via RECTAL
  Filled 2016-02-14: qty 1

## 2016-02-14 MED ORDER — VANCOMYCIN HCL IN DEXTROSE 1-5 GM/200ML-% IV SOLN
1000.0000 mg | Freq: Once | INTRAVENOUS | Status: AC
  Administered 2016-02-14: 1000 mg via INTRAVENOUS
  Filled 2016-02-14: qty 200

## 2016-02-14 NOTE — ED Notes (Signed)
Patient transported to Ultrasound 

## 2016-02-14 NOTE — ED Notes (Signed)
Patient transported to CT 

## 2016-02-14 NOTE — Progress Notes (Signed)
Pharmacy Antibiotic Note  Carl MedalDouglas R Nasser is a 56 y.o. male admitted on 02/14/2016 with sepsis.  Pharmacy has been consulted for vancomycin and Zosyn dosing.  Patient received first doses of antibiotics around 1500 today.  Labs returned showing that he has AKI with CrCL ~22 ml/min.   Plan: - Vanc 750mg  IV Q24H for goal trough 15-20 mcg/mL, start tomorrow - Zosyn 3.375gm IV Q8H, 4 hr infusion - Monitor renal fxn, micro data, vanc trough as indicated   Height: 5\' 10"  (177.8 cm) Weight: 170 lb (77.1 kg) IBW/kg (Calculated) : 73  Temp (24hrs), Avg:99.5 F (37.5 C), Min:98.8 F (37.1 C), Max:100.2 F (37.9 C)   Recent Labs Lab 02/14/16 1440 02/14/16 1518  WBC 7.8  --   CREATININE 4.01*  --   LATICACIDVEN  --  1.56    Estimated Creatinine Clearance: 22 mL/min (by C-G formula based on SCr of 4.01 mg/dL (H)).    Not on File  Antimicrobials this admission:  Vanc 1/12 >> Zosyn 1/12 >>  Dose adjustments this admission:  N/A  Microbiology results:  1/12 BCx x2 -   Chelsea Aushuy D. Laney Potashang, PharmD, BCPS Pager:  [phone] 02/14/2016, 5:13 PM

## 2016-02-14 NOTE — ED Triage Notes (Signed)
Pt in from home with family with c/o AMS x 2 days, bilateral extremity weakness and R sided gaze. Per EMS, these s/s began 2 days ago and pt has been chair-bound since. Extremities equal in strength, no hx of CVA. Per EMS, pt had sats of 88%, was placed on 2L. BP was 94/68, EMS gave 50 ml's NS

## 2016-02-14 NOTE — H&P (Addendum)
History and Physical    Carl Mcdonald ZOX:[id number] DOB: Jan 02, 1961 DOA: 02/14/2016    PCP: No PCP Per Patient  Patient coming from: home  Chief Complaint: altered mental status  HPI: Carl Mcdonald is a 55 y.o. male who is brought in by EMS from home for confusion. There is no family present to give history. I have tried the numbers on the face sheet and I am unable to reach family. The patient is confused and he knows only his name. According to history obtained by the EMS, the patient has been confused at home for past 2 days. He was noted to have a pulse ox of 88% when EMS arrived to pick him up. According to the nurse, the patient's wife was just in the ER earlier today but has left for home.  ED Course:  Temp 100.2, BP 96/54, pulse ox 97% on 3 L VBG: PH 7.44, PCO2 38.5, PO2 70, bicarbonate 26.1 BUN 32, creatinine 4.01 Hemoglobin 9.1, platelets 60  Review of Systems:  All other systems reviewed and apart from HPI, are negative.  History reviewed. No pertinent past medical history.  History reviewed. No pertinent surgical history.  Social History:   reports that he has quit smoking. He has never used smokeless tobacco. His alcohol and drug histories are not on file.  Not on File  No family history on file.   Prior to Admission medications   Medication Sig Start Date End Date Taking? Authorizing Provider  divalproex (DEPAKOTE ER) 500 MG 24 hr tablet Take 250-500 mg by mouth 2 (two) times daily.   Yes Historical Provider, MD  meloxicam (MOBIC) 15 MG tablet Take 15 mg by mouth daily.   Yes Historical Provider, MD  OLANZapine (ZYPREXA) 15 MG tablet Take 15 mg by mouth at bedtime.   Yes Historical Provider, MD  simvastatin (ZOCOR) 40 MG tablet Take 40 mg by mouth daily at 6 PM.   Yes Historical Provider, MD  tiZANidine (ZANAFLEX) 4 MG tablet Take 4 mg by mouth every 6 (six) hours as needed for muscle spasms.   Yes Historical Provider, MD    Physical Exam: Vitals:   02/14/16 1615 02/14/16 1630 02/14/16 1636 02/14/16 1645  BP: 116/71 113/74  90/61  Pulse: 79 78  71  Resp: 15 21  19   Temp:   98.8 F (37.1 C)   TempSrc:   Rectal   SpO2: 95% 95%  95%  Weight:      Height:          Constitutional: NAD, alert, confused to time place and situation, calm, comfortable- able to follow commands Eyes: PERTLA, conjunctivae pale ENMT: Mucous membranes are severely dry Posterior pharynx clear of any exudate or lesions. Normal dentition.  Neck: normal, supple, no masses, no thyromegaly Respiratory: clear to auscultation bilaterally, no wheezing, no crackles. Normal respiratory effort. No accessory muscle use. - on 2 L O2- pulse ox 94% Cardiovascular: S1 & S2 heard, regular rate and rhythm, no murmurs / rubs / gallops. No extremity edema. 2+ pedal pulses. No carotid bruits.  Abdomen: No distension, no tenderness, no masses palpated. No hepatosplenomegaly. Bowel sounds normal.  Musculoskeletal: no clubbing / cyanosis. No joint deformity upper and lower extremities. Good ROM, no contractures. Normal muscle tone.  Skin: no rashes, lesions, ulcers. No induration Neurologic: CN 2-12 grossly intact. Sensation intact, Strength 5/5 in all 4 limbs. Following commands- ER doctor initially noted right gaze preference however, on my exam, he is able to look to  the left, make eye contact and verbalize quite well Psychiatric: oriented only to self-     Labs on Admission: I have personally reviewed following labs and imaging studies  CBC:  Recent Labs Lab 02/14/16 1440  WBC 7.8  NEUTROABS 5.2  HGB 9.1*  HCT 27.7*  MCV 103.4*  PLT 60*   Basic Metabolic Panel:  Recent Labs Lab 02/14/16 1440  NA 137  K 4.2  CL 103  CO2 23  GLUCOSE 90  BUN 32*  CREATININE 4.01*  CALCIUM 8.4*   GFR: Estimated Creatinine Clearance: 22 mL/min (by C-G formula based on SCr of 4.01 mg/dL (H)). Liver Function Tests:  Recent Labs Lab 02/14/16 1440  AST 36  ALT 10*  ALKPHOS  60  BILITOT 1.6*  PROT 5.8*  ALBUMIN 2.9*   No results for input(s): LIPASE, AMYLASE in the last 168 hours. No results for input(s): AMMONIA in the last 168 hours. Coagulation Profile: No results for input(s): INR, PROTIME in the last 168 hours. Cardiac Enzymes: No results for input(s): CKTOTAL, CKMB, CKMBINDEX, TROPONINI in the last 168 hours. BNP (last 3 results) No results for input(s): PROBNP in the last 8760 hours. HbA1C: No results for input(s): HGBA1C in the last 72 hours. CBG: No results for input(s): GLUCAP in the last 168 hours. Lipid Profile: No results for input(s): CHOL, HDL, LDLCALC, TRIG, CHOLHDL, LDLDIRECT in the last 72 hours. Thyroid Function Tests: No results for input(s): TSH, T4TOTAL, FREET4, T3FREE, THYROIDAB in the last 72 hours. Anemia Panel: No results for input(s): VITAMINB12, FOLATE, FERRITIN, TIBC, IRON, RETICCTPCT in the last 72 hours. Urine analysis:    Component Value Date/Time   COLORURINE AMBER (A) 02/14/2016 1638   APPEARANCEUR CLEAR 02/14/2016 1638   APPEARANCEUR Clear 07/28/2011 1849   LABSPEC 1.009 02/14/2016 1638   LABSPEC 1.004 07/28/2011 1849   PHURINE 5.0 02/14/2016 1638   GLUCOSEU NEGATIVE 02/14/2016 1638   GLUCOSEU Negative 07/28/2011 1849   HGBUR NEGATIVE 02/14/2016 1638   BILIRUBINUR NEGATIVE 02/14/2016 1638   BILIRUBINUR Negative 07/28/2011 1849   KETONESUR NEGATIVE 02/14/2016 1638   PROTEINUR NEGATIVE 02/14/2016 1638   NITRITE NEGATIVE 02/14/2016 1638   LEUKOCYTESUR NEGATIVE 02/14/2016 1638   LEUKOCYTESUR Negative 07/28/2011 1849   Sepsis Labs: @LABRCNTIP (procalcitonin:4,lacticidven:4) )No results found for this or any previous visit (from the past 240 hour(s)).   Radiological Exams on Admission: Ct Head Wo Contrast  Result Date: 02/14/2016 CLINICAL DATA:  Dizziness.  Altered mental status. EXAM: CT HEAD WITHOUT CONTRAST TECHNIQUE: Contiguous axial images were obtained from the base of the skull through the vertex  without intravenous contrast. COMPARISON:  CT scan of July 24, 2011. FINDINGS: Brain: No evidence of acute infarction, hemorrhage, hydrocephalus, extra-axial collection or mass lesion/mass effect. Vascular: No hyperdense vessel or unexpected calcification. Skull: Normal. Negative for fracture or focal lesion. Sinuses/Orbits: No acute finding. Other: None. IMPRESSION: Normal head CT. Electronically Signed   By: Lupita RaiderJames  Green Jr, M.D.   On: 02/14/2016 17:16   Dg Chest Port 1 View  Result Date: 02/14/2016 CLINICAL DATA:  Altered mental status. EXAM: PORTABLE CHEST 1 VIEW COMPARISON:  Radiographs of November 23, 2014. FINDINGS: Stable cardiomediastinal silhouette. Hypoinflation of the lungs is noted with mild bibasilar subsegmental atelectasis. No pneumothorax or significant pleural effusion is noted. Bony thorax is intact. IMPRESSION: Hypoinflation of the lungs with mild bibasilar subsegmental atelectasis. Electronically Signed   By: Lupita RaiderJames  Green Jr, M.D.   On: 02/14/2016 15:22    EKG: Independently reviewed. NSR at 79 bpm  Assessment/Plan Active Problems:   Encephalopathy - Broad differential at this point as limited history is available -CT head is unrevealing - Obtain ammonia level, Depakote level, MRI brain - Neurochecks, follow for seizures -Would like to obtain LP to rule out encephalitis however platelets are too low    Acute renal failure  - BUN and creatinine pattern not suggestive of prerenal cause unless BUN is low due to malnutrition -will obtain urine sodium and creatinine to calculate a FeNa - UA unrevealing - Obtain renal ultrasound  Metabolic alkalosis -pH only mildly elevated at 7.44, serum bicarbonate is normal - Hydrate with IV fluids and follow  Low Grade fever -? Underlying infectious process - lactic acid normal - UA negative-chest x-ray clear but hypoxic- check flu PCR- repeat CXR tomorrow AM after hydrating him -Continue to follow fever curve-blood cultures have been  obtained    Anemia - We'll obtain an anemia panel and stool Hemoccults -As bilirubin is elevated, will obtain LDH and haptoglobin levels  Hypotension - given 2 L IVF in ER- will continue slow IVF- follow I and O  Elevated bilirubin -AST normal, ALT is low -Alkaline phosphatase is normal -Follow    Thrombocytopenia  - Difficult to tell if this is acute or chronic -We'll also obtain PT/INR  ? underlying psychiatry disorder - home med list mentions Depakote & Zyprexa    DVT prophylaxis: SCDs  Code Status: Full code  Family Communication: unable to reach family   Disposition Plan:   Consults called:   Admission status: inpatient    [facility] MD [facility] Pager: [email or web address] Password [email or web address] 7PM-7AM, please contact night-coverage   02/14/2016, 6:03 PM

## 2016-02-14 NOTE — ED Notes (Signed)
[facility] [place]. PT will be moved to pod C

## 2016-02-14 NOTE — ED Notes (Signed)
No addl lab draw,  Pt enroute to inpatient 

## 2016-02-14 NOTE — ED Notes (Signed)
Lab called and state blue top was hemolyzed and they need a redraw. This RN reordered PT INR.

## 2016-02-14 NOTE — ED Notes (Signed)
EKG given to Dr. Kohut 

## 2016-02-14 NOTE — ED Provider Notes (Signed)
[facility]-EMERGENCY DEPT Provider Note   CSN: [id number] Arrival date & time: 02/14/16  1415     History   Chief Complaint Chief Complaint  Patient presents with  . Code Sepsis  . Altered Mental Status    HPI LAMAJ Carl Mcdonald is a 56 y.o. male.  HPI 56 year old male with past medical history as below who presents with altered mental status. Patient has reportedly had decreased level of consciousness for the last several days. He is normally alert, oriented, and ambulatory at baseline but has had decreased intake and confusion for the last 48 hours. Is reportedly not gotten up out of his chair. Per EMS, unsure why patient was not brought in earlier. He has reportedly had mild hypoxia, tachycardia, and a cough and route. No known sick contacts. On arrival, patient notably altered and unable to provide further history.  Level 5 caveat invoked as remainder of history, ROS, and physical exam limited due to patient's AMS.   History reviewed. No pertinent past medical history.  There are no active problems to display for this patient.   History reviewed. No pertinent surgical history.     Home Medications    Prior to Admission medications   Medication Sig Start Date End Date Taking? Authorizing Provider  divalproex (DEPAKOTE ER) 500 MG 24 hr tablet Take 250-500 mg by mouth 2 (two) times daily.   Yes Historical Provider, MD  meloxicam (MOBIC) 15 MG tablet Take 15 mg by mouth daily.   Yes Historical Provider, MD  OLANZapine (ZYPREXA) 15 MG tablet Take 15 mg by mouth at bedtime.   Yes Historical Provider, MD  simvastatin (ZOCOR) 40 MG tablet Take 40 mg by mouth daily at 6 PM.   Yes Historical Provider, MD  tiZANidine (ZANAFLEX) 4 MG tablet Take 4 mg by mouth every 6 (six) hours as needed for muscle spasms.   Yes Historical Provider, MD    Family History No family history on file.  Social History Social History  Substance Use Topics  . Smoking status: Former Games developer  . Smokeless  tobacco: Never Used  . Alcohol use Not on file     Allergies   Patient has no allergy information on record.   Review of Systems Review of Systems  Unable to perform ROS: Mental status change     Physical Exam Updated Vital Signs BP 90/61   Pulse 71   Temp 98.8 F (37.1 C) (Rectal)   Resp 19   Ht 5\' 10"  (1.778 m)   Wt 170 lb (77.1 kg)   SpO2 95%   BMI 24.39 kg/m   Physical Exam  Constitutional: He is oriented to person, place, and time. He appears well-developed. He appears lethargic. No distress.  HENT:  Head: Normocephalic and atraumatic.  Mouth/Throat: Oropharynx is clear and moist. No oropharyngeal exudate.  Eyes: Conjunctivae are normal. Pupils are equal, round, and reactive to light.  Neck: Neck supple.  Cardiovascular: Normal rate, regular rhythm and normal heart sounds.  Exam reveals no friction rub.   No murmur heard. Pulmonary/Chest: Effort normal and breath sounds normal. No respiratory distress. He has no wheezes. He has no rales.  Abdominal: Soft. Bowel sounds are normal. He exhibits no distension. There is no tenderness. There is no rebound.  Musculoskeletal: He exhibits no edema.  Neurological: He is oriented to person, place, and time. He appears lethargic. He exhibits normal muscle tone.  Drowsy. Favors right side with rightward gaze but able to move LUE and LLE with 5/5 strength.  Normal reflexes. Unable to participate in cerebellar testing.  Skin: Skin is warm. Capillary refill takes less than 2 seconds.  Psychiatric: He has a normal mood and affect.  Nursing note and vitals reviewed.    ED Treatments / Results  Labs (all labs ordered are listed, but only abnormal results are displayed) Labs Reviewed  COMPREHENSIVE METABOLIC PANEL - Abnormal; Notable for the following:       Result Value   BUN 32 (*)    Creatinine, Ser 4.01 (*)    Calcium 8.4 (*)    Total Protein 5.8 (*)    Albumin 2.9 (*)    ALT 10 (*)    Total Bilirubin 1.6 (*)    GFR  calc non Af Amer 16 (*)    GFR calc Af Amer 18 (*)    All other components within normal limits  CBC WITH DIFFERENTIAL/PLATELET - Abnormal; Notable for the following:    RBC 2.68 (*)    Hemoglobin 9.1 (*)    HCT 27.7 (*)    MCV 103.4 (*)    RDW 17.7 (*)    Platelets 60 (*)    All other components within normal limits  URINALYSIS, ROUTINE W REFLEX MICROSCOPIC - Abnormal; Notable for the following:    Color, Urine AMBER (*)    All other components within normal limits  I-STAT VENOUS BLOOD GAS, ED - Abnormal; Notable for the following:    pH, Ven 7.440 (*)    pCO2, Ven 38.5 (*)    pO2, Ven 70.0 (*)    All other components within normal limits  CULTURE, BLOOD (ROUTINE X 2)  CULTURE, BLOOD (ROUTINE X 2)  URINE CULTURE  BASIC METABOLIC PANEL  AMMONIA  I-STAT CG4 LACTIC ACID, ED  I-STAT CG4 LACTIC ACID, ED    EKG  EKG Interpretation  Date/Time:  Friday February 14 2016 15:29:47 EST Ventricular Rate:  79 PR Interval:    QRS Duration: 96 QT Interval:  383 QTC Calculation: 439 R Axis:   29 Text Interpretation:  Sinus rhythm Abnormal R-wave progression, early transition Non-specific ST-t changes Confirmed by Juleen China  MD, STEPHEN [phone]) on 02/14/2016 3:33:37 PM       Radiology Ct Head Wo Contrast  Result Date: 02/14/2016 CLINICAL DATA:  Dizziness.  Altered mental status. EXAM: CT HEAD WITHOUT CONTRAST TECHNIQUE: Contiguous axial images were obtained from the base of the skull through the vertex without intravenous contrast. COMPARISON:  CT scan of July 24, 2011. FINDINGS: Brain: No evidence of acute infarction, hemorrhage, hydrocephalus, extra-axial collection or mass lesion/mass effect. Vascular: No hyperdense vessel or unexpected calcification. Skull: Normal. Negative for fracture or focal lesion. Sinuses/Orbits: No acute finding. Other: None. IMPRESSION: Normal head CT. Electronically Signed   By: Lupita Raider, M.D.   On: 02/14/2016 17:16   Dg Chest Port 1 View  Result Date:  02/14/2016 CLINICAL DATA:  Altered mental status. EXAM: PORTABLE CHEST 1 VIEW COMPARISON:  Radiographs of November 23, 2014. FINDINGS: Stable cardiomediastinal silhouette. Hypoinflation of the lungs is noted with mild bibasilar subsegmental atelectasis. No pneumothorax or significant pleural effusion is noted. Bony thorax is intact. IMPRESSION: Hypoinflation of the lungs with mild bibasilar subsegmental atelectasis. Electronically Signed   By: Lupita Raider, M.D.   On: 02/14/2016 15:22    Procedures Procedures (including critical care time)  Medications Ordered in ED Medications  vancomycin (VANCOCIN) IVPB 750 mg/150 ml premix (not administered)  piperacillin-tazobactam (ZOSYN) IVPB 3.375 g (not administered)  piperacillin-tazobactam (ZOSYN) IVPB 3.375 g (  0 g Intravenous Stopped 02/14/16 1526)  vancomycin (VANCOCIN) IVPB 1000 mg/200 mL premix (0 mg Intravenous Stopped 02/14/16 1556)  acetaminophen (TYLENOL) suppository 650 mg (650 mg Rectal Given 02/14/16 1517)  sodium chloride 0.9 % bolus 1,000 mL (0 mLs Intravenous Stopped 02/14/16 1539)    And  sodium chloride 0.9 % bolus 1,000 mL (0 mLs Intravenous Stopped 02/14/16 1610)    And  sodium chloride 0.9 % bolus 500 mL (500 mLs Intravenous New Bag/Given 02/14/16 1632)     Initial Impression / Assessment and Plan / ED Course  I have reviewed the triage vital signs and the nursing notes.  Pertinent labs & imaging results that were available during my care of the patient were reviewed by me and considered in my medical decision making (see chart for details).  Clinical Course     56 yo M with PMHx as above here with AMS. On exam, pt dehydrated, altered, favoring right side but able to move b/l UE and LE w/o drift. Concern for acute encephalopathy 2/2 sepsis, metabolic derangement, also must consider CVA. Will start code sepsis given hypotension, T 100.2 orally, and obtain stat imaging.   Labs, imaging reviewed as above. Of note, pt now with AKI  with BUN 32, Cr 4.0 (though last Cr several years ago). He is also newly hypo-albuminemia, thrombocytopenia, anemia. Concern for possible cirrhosis, also possible hyperammonemia - will check. Will continue fluids, admit for AMS, AKI.  Final Clinical Impressions(s) / ED Diagnoses   Final diagnoses:  Confused  AKI (acute kidney injury) (HCC)    New Prescriptions New Prescriptions   No medications on file     Shaune Pollackameron Aarron Wierzbicki, MD 02/15/16 1157

## 2016-02-14 NOTE — Progress Notes (Signed)
Pharmacy Antibiotic Note  Carl Mcdonald is a 56 y.o. male admitted on 02/14/2016 with sepsis.  Pharmacy has been consulted for vancomycin and zosyn dosing.  Patient received vancomycin 1g and zosyn 3.375g IV once in the ED.  Plan: Will redose antibiotics based on renal function once labs return Monitor culture data, renal function and clinical course VT at SS prn   Height: 5\' 10"  (177.8 cm) Weight: 170 lb (77.1 kg) IBW/kg (Calculated) : 73  Temp (24hrs), Avg:100.2 F (37.9 C), Min:100.2 F (37.9 C), Max:100.2 F (37.9 C)  No results for input(s): WBC, CREATININE, LATICACIDVEN, VANCOTROUGH, VANCOPEAK, VANCORANDOM, GENTTROUGH, GENTPEAK, GENTRANDOM, TOBRATROUGH, TOBRAPEAK, TOBRARND, AMIKACINPEAK, AMIKACINTROU, AMIKACIN in the last 168 hours.  CrCl cannot be calculated (Patient's most recent lab result is older than the maximum 21 days allowed.).    Not on File   Arlean HoppingCorey M. Newman PiesBall, PharmD, BCPS Clinical Pharmacist Pager [phone] 02/14/2016 2:31 PM

## 2016-02-14 NOTE — ED Notes (Signed)
Called phlebotomy to get ammonia level on pt.

## 2016-02-14 NOTE — ED Notes (Signed)
Patient at MRI 

## 2016-02-14 NOTE — ED Notes (Signed)
Delay in lab draw,  Pt not in room 

## 2016-02-15 ENCOUNTER — Inpatient Hospital Stay (HOSPITAL_COMMUNITY): Payer: Medicaid Other

## 2016-02-15 DIAGNOSIS — L8990 Pressure ulcer of unspecified site, unspecified stage: Secondary | ICD-10-CM | POA: Insufficient documentation

## 2016-02-15 DIAGNOSIS — G9340 Encephalopathy, unspecified: Secondary | ICD-10-CM

## 2016-02-15 DIAGNOSIS — R339 Retention of urine, unspecified: Secondary | ICD-10-CM

## 2016-02-15 LAB — CBC
HEMATOCRIT: 27.8 % — AB (ref 39.0–52.0)
Hemoglobin: 9.2 g/dL — ABNORMAL LOW (ref 13.0–17.0)
MCH: 33.8 pg (ref 26.0–34.0)
MCHC: 33.1 g/dL (ref 30.0–36.0)
MCV: 102.2 fL — AB (ref 78.0–100.0)
PLATELETS: 49 10*3/uL — AB (ref 150–400)
RBC: 2.72 MIL/uL — AB (ref 4.22–5.81)
RDW: 17.2 % — ABNORMAL HIGH (ref 11.5–15.5)
WBC: 5.7 10*3/uL (ref 4.0–10.5)

## 2016-02-15 LAB — URINE CULTURE: Culture: NO GROWTH

## 2016-02-15 LAB — HAPTOGLOBIN: HAPTOGLOBIN: 70 mg/dL (ref 34–200)

## 2016-02-15 LAB — CREATININE, URINE, RANDOM: Creatinine, Urine: 203.47 mg/dL

## 2016-02-15 LAB — MRSA PCR SCREENING: MRSA by PCR: NEGATIVE

## 2016-02-15 LAB — INFLUENZA PANEL BY PCR (TYPE A & B)
Influenza A By PCR: NEGATIVE
Influenza B By PCR: NEGATIVE

## 2016-02-15 LAB — SODIUM, URINE, RANDOM: Sodium, Ur: 17 mmol/L

## 2016-02-15 MED ORDER — ORAL CARE MOUTH RINSE
15.0000 mL | Freq: Two times a day (BID) | OROMUCOSAL | Status: DC
  Administered 2016-02-15 – 2016-02-19 (×9): 15 mL via OROMUCOSAL

## 2016-02-15 MED ORDER — METOPROLOL SUCCINATE ER 25 MG PO TB24
25.0000 mg | ORAL_TABLET | Freq: Every day | ORAL | Status: DC
  Administered 2016-02-15 – 2016-02-19 (×5): 25 mg via ORAL
  Filled 2016-02-15 (×5): qty 1

## 2016-02-15 MED ORDER — DIVALPROEX SODIUM ER 500 MG PO TB24
750.0000 mg | ORAL_TABLET | Freq: Every day | ORAL | Status: DC
  Administered 2016-02-15 – 2016-02-19 (×5): 750 mg via ORAL
  Filled 2016-02-15 (×6): qty 1

## 2016-02-15 MED ORDER — PANTOPRAZOLE SODIUM 40 MG PO TBEC
40.0000 mg | DELAYED_RELEASE_TABLET | Freq: Every day | ORAL | Status: DC
  Administered 2016-02-15 – 2016-02-19 (×5): 40 mg via ORAL
  Filled 2016-02-15 (×5): qty 1

## 2016-02-15 MED ORDER — OLANZAPINE 7.5 MG PO TABS
15.0000 mg | ORAL_TABLET | Freq: Every day | ORAL | Status: DC
  Administered 2016-02-15 – 2016-02-18 (×4): 15 mg via ORAL
  Filled 2016-02-15 (×4): qty 2

## 2016-02-15 MED ORDER — VANCOMYCIN HCL IN DEXTROSE 1-5 GM/200ML-% IV SOLN
1000.0000 mg | INTRAVENOUS | Status: DC
  Administered 2016-02-15: 1000 mg via INTRAVENOUS
  Filled 2016-02-15 (×2): qty 200

## 2016-02-15 MED ORDER — TAMSULOSIN HCL 0.4 MG PO CAPS
0.4000 mg | ORAL_CAPSULE | Freq: Every day | ORAL | Status: DC
  Administered 2016-02-15 – 2016-02-18 (×4): 0.4 mg via ORAL
  Filled 2016-02-15 (×4): qty 1

## 2016-02-15 MED ORDER — BENZTROPINE MESYLATE 1 MG PO TABS
1.0000 mg | ORAL_TABLET | Freq: Two times a day (BID) | ORAL | Status: DC
  Administered 2016-02-15 – 2016-02-19 (×9): 1 mg via ORAL
  Filled 2016-02-15 (×10): qty 1

## 2016-02-15 NOTE — Progress Notes (Signed)
Pharmacy Antibiotic Note  Carl Mcdonald is a 56 y.o. male admitted on 02/14/2016 with sepsis.  Pharmacy has been consulted for vancomycin and Zosyn dosing.  Renal function improving and weight changed per RN documentation. Spoke with RN for repeat weight. Dose adjusted for both new weight and for improved renal function. WBC wnl and patient afebrile.   Plan: - Increase vancomycin to 1g IV Q24H for goal trough 15-20 mcg/mL - Zosyn 3.375gm IV Q8H, 4 hr infusion - Monitor renal fxn, micro data, vanc trough as indicated   Height: 6' (182.9 cm) Weight: 198 lb 10.2 oz (90.1 kg) IBW/kg (Calculated) : 77.6  Temp (24hrs), Avg:98.7 F (37.1 C), Min:97.2 F (36.2 C), Max:100.2 F (37.9 C)   Recent Labs Lab 02/14/16 1440 02/14/16 1518 02/14/16 1814 02/14/16 2131 02/15/16 0312  WBC 7.8  --   --   --  5.7  CREATININE 4.01*  --   --  3.31*  --   LATICACIDVEN  --  1.56 1.50  --   --     Estimated Creatinine Clearance: 28.3 mL/min (by C-G formula based on SCr of 3.31 mg/dL (H)).    Antimicrobials this admission:  Vanc 1/12 >> Zosyn 1/12 >>  Dose adjustments this admission:  1/13 Vanc 750mg  IV q24 > 1000 mg IV q24h (change in weight - checked x2)  Microbiology results:  1/12 BCx x2 - sent 1/12 urine - sent 1/12 MRSA PCT - negative  1/12 Flu A - negative   York CeriseKatherine Cook, PharmD Pharmacy Resident  Pager [phone] 02/15/16 10:22 AM

## 2016-02-15 NOTE — Progress Notes (Signed)
Patient only urinated 200cc since arriving from the emergency department. Bladder scan showed >999. Burnadette PeterLynch, NP notified. In-and-out cath was ordered and performed. 1150cc of amber colored urine was drained from the bladder without complications. Will continue to monitor.

## 2016-02-15 NOTE — Progress Notes (Signed)
PROGRESS NOTE    Carl Mcdonald  ZOX:[id number] DOB: 1960-10-27 DOA: 02/14/2016  PCP: No PCP Per Patient   Brief Narrative:  Carl Mcdonald is a 56 y.o. male who is brought in by EMS from home for confusion. According to history obtained by the EMS, the patient has been confused at home for past 2 days. He was noted to have a pulse ox of 88% when EMS arrived to pick him up. The patient was too confused to give a history.  Per caretaker who is here today, the patient is usually confused at baseline and has schizophrenia. He has been confused for about 2-3 days and "sluggish" for about 1-2 days. He does smoke but does not drink alcohol.   Subjective: No complaints today. Still appears confused.   Assessment & Plan:   Principal Problem:   Acute encephalopathy - possibly due to AKI and ? Underlying infection as he was mildly febrile on initial presentation - CT head normal- MRI not yet done - Obtain ammonia level only 39 - Depakote level normal - Would like to have obtained LP to rule out encephalitis however platelets are too low    Acute renal failure  - due to urinary retention>> although patient was urinating, he is found to later have about 1 L Urine in his bladder- Foley has been placed- start flomax -- prerenal >>obtained urine sodium also low and consistent with dehydration - UA unrevealing -  renal ultrasound nromal  Metabolic alkalosis -pH mildly elevated at 7.44, serum bicarbonate is normal   Low Grade fever -? Underlying infectious process - lactic acid normal - UA negative-chest x-ray clear but hypoxic-  flu PCR negative- repeat CXR after hydrating him still negative for infiltrates hypoxia and fever resolved todayAM after hydrating him     Anemia -  anemia panel >> AOCD - Hemoccult pending -As bilirubin elevated, obtained LDH and haptoglobin levels - not hemolysing  Hypotension - given 2 L IVF in ER- will continue slow IVF- follow I and O  Elevated  bilirubin -AST normal, ALT is low -Alkaline phosphatase is normal -Following     Thrombocytopenia  - Difficult to tell if this is acute or chronic - both Zyprexa and Depakote can cause this but not safe to hold in patient with schizophrenia - follow- If platelets continue to drop, will need to d/c psych meds - avoiding blood thinners  Schizophrenia - home med list mentions Depakote & Zyprexa   DVT prophylaxis: SCDs Code Status: Full code Family Communication: POA- brother in law/ caretaker Disposition Plan: return to group home when stable Consultants:    Procedures:    Antimicrobials:  Anti-infectives    Start     Dose/Rate Route Frequency Ordered Stop   02/15/16 1500  vancomycin (VANCOCIN) IVPB 750 mg/150 ml premix  Status:  Discontinued     750 mg 150 mL/hr over 60 Minutes Intravenous Every 24 hours 02/14/16 1715 02/15/16 1019   02/15/16 1500  vancomycin (VANCOCIN) IVPB 1000 mg/200 mL premix     1,000 mg 200 mL/hr over 60 Minutes Intravenous Every 24 hours 02/15/16 1019     02/14/16 2300  piperacillin-tazobactam (ZOSYN) IVPB 3.375 g     3.375 g 12.5 mL/hr over 240 Minutes Intravenous Every 8 hours 02/14/16 1715     02/14/16 1430  piperacillin-tazobactam (ZOSYN) IVPB 3.375 g     3.375 g 100 mL/hr over 30 Minutes Intravenous  Once 02/14/16 1428 02/14/16 1526   02/14/16 1430  vancomycin (VANCOCIN)  IVPB 1000 mg/200 mL premix     1,000 mg 200 mL/hr over 60 Minutes Intravenous  Once 02/14/16 1428 02/14/16 1556       Objective: Vitals:   02/15/16 0336 02/15/16 0700 02/15/16 0900 02/15/16 1206  BP: (!) 145/79 135/77  113/73  Pulse: 76   87  Resp: 18   18  Temp: 99.1 F (37.3 C) 97.2 F (36.2 C)  98.5 F (36.9 C)  TempSrc: Oral Oral  Oral  SpO2: 99%   98%  Weight:   90.1 kg (198 lb 10.2 oz) 88.9 kg (195 lb 14.4 oz)  Height:        Intake/Output Summary (Last 24 hours) at 02/15/16 1351 Last data filed at 02/15/16 4401  Gross per 24 hour  Intake            783.75 ml  Output             1350 ml  Net          -566.25 ml   Filed Weights   02/14/16 2058 02/15/16 0900 02/15/16 1206  Weight: 87.7 kg (193 lb 5.5 oz) 90.1 kg (198 lb 10.2 oz) 88.9 kg (195 lb 14.4 oz)    Examination: General exam: Appears comfortable - confused to time and place HEENT: PERRLA,  no sclera icterus or thrush- severely dry oral mucosa Respiratory system: Clear to auscultation. Respiratory effort normal. Cardiovascular system: S1 & S2 heard, RRR.  No murmurs  Gastrointestinal system: Abdomen soft, non-tender, nondistended. Normal bowel sound. No organomegaly Central nervous system: Alert and oriented. No focal neurological deficits. Extremities: No cyanosis, clubbing or edema Skin: No rashes or ulcers Psychiatry:  Mood & affect appropriate.     Data Reviewed: I have personally reviewed following labs and imaging studies  CBC:  Recent Labs Lab 02/14/16 1440 02/15/16 0312  WBC 7.8 5.7  NEUTROABS 5.2  --   HGB 9.1* 9.2*  HCT 27.7* 27.8*  MCV 103.4* 102.2*  PLT 60* 49*   Basic Metabolic Panel:  Recent Labs Lab 02/14/16 1440 02/14/16 2131  NA 137 141  K 4.2 4.0  CL 103 108  CO2 23 23  GLUCOSE 90 93  BUN 32* 30*  CREATININE 4.01* 3.31*  CALCIUM 8.4* 8.2*   GFR: Estimated Creatinine Clearance: 28.3 mL/min (by C-G formula based on SCr of 3.31 mg/dL (H)). Liver Function Tests:  Recent Labs Lab 02/14/16 1440 02/14/16 2131  AST 36 63*  ALT 10* 10*  ALKPHOS 60 57  BILITOT 1.6* 2.0*  PROT 5.8* 6.0*  ALBUMIN 2.9* 2.8*   No results for input(s): LIPASE, AMYLASE in the last 168 hours.  Recent Labs Lab 02/14/16 1816  AMMONIA 39*   Coagulation Profile:  Recent Labs Lab 02/14/16 2131  INR 1.50   Cardiac Enzymes: No results for input(s): CKTOTAL, CKMB, CKMBINDEX, TROPONINI in the last 168 hours. BNP (last 3 results) No results for input(s): PROBNP in the last 8760 hours. HbA1C: No results for input(s): HGBA1C in the last 72  hours. CBG: No results for input(s): GLUCAP in the last 168 hours. Lipid Profile: No results for input(s): CHOL, HDL, LDLCALC, TRIG, CHOLHDL, LDLDIRECT in the last 72 hours. Thyroid Function Tests: No results for input(s): TSH, T4TOTAL, FREET4, T3FREE, THYROIDAB in the last 72 hours. Anemia Panel:  Recent Labs  02/14/16 1818 02/14/16 1819 02/14/16 2131  VITAMINB12 385  --  305  FOLATE  --  12.8 9.4  FERRITIN 220  --  269  TIBC 314  --  358  IRON 38*  --  24*  RETICCTPCT 1.6  --  2.4   Urine analysis:    Component Value Date/Time   COLORURINE AMBER (A) 02/14/2016 1638   APPEARANCEUR CLEAR 02/14/2016 1638   APPEARANCEUR Clear 07/28/2011 1849   LABSPEC 1.009 02/14/2016 1638   LABSPEC 1.004 07/28/2011 1849   PHURINE 5.0 02/14/2016 1638   GLUCOSEU NEGATIVE 02/14/2016 1638   GLUCOSEU Negative 07/28/2011 1849   HGBUR NEGATIVE 02/14/2016 1638   BILIRUBINUR NEGATIVE 02/14/2016 1638   BILIRUBINUR Negative 07/28/2011 1849   KETONESUR NEGATIVE 02/14/2016 1638   PROTEINUR NEGATIVE 02/14/2016 1638   NITRITE NEGATIVE 02/14/2016 1638   LEUKOCYTESUR NEGATIVE 02/14/2016 1638   LEUKOCYTESUR Negative 07/28/2011 1849   Sepsis Labs: @LABRCNTIP (procalcitonin:4,lacticidven:4) ) Recent Results (from the past 240 hour(s))  Blood Culture (routine x 2)     Status: None (Preliminary result)   Collection Time: 02/14/16  2:40 PM  Result Value Ref Range Status   Specimen Description BLOOD LEFT HAND  Final   Special Requests BOTTLES DRAWN AEROBIC AND ANAEROBIC 5CC  Final   Culture NO GROWTH < 24 HOURS  Final   Report Status PENDING  Incomplete  Blood Culture (routine x 2)     Status: None (Preliminary result)   Collection Time: 02/14/16  3:11 PM  Result Value Ref Range Status   Specimen Description BLOOD RIGHT HAND  Final   Special Requests BOTTLES DRAWN AEROBIC ONLY 10CC  Final   Culture NO GROWTH < 24 HOURS  Final   Report Status PENDING  Incomplete  MRSA PCR Screening     Status: None    Collection Time: 02/14/16 10:34 PM  Result Value Ref Range Status   MRSA by PCR NEGATIVE NEGATIVE Final    Comment:        The GeneXpert MRSA Assay (FDA approved for NASAL specimens only), is one component of a comprehensive MRSA colonization surveillance program. It is not intended to diagnose MRSA infection nor to guide or monitor treatment for MRSA infections.          Radiology Studies: Ct Head Wo Contrast  Result Date: 02/14/2016 CLINICAL DATA:  Dizziness.  Altered mental status. EXAM: CT HEAD WITHOUT CONTRAST TECHNIQUE: Contiguous axial images were obtained from the base of the skull through the vertex without intravenous contrast. COMPARISON:  CT scan of July 24, 2011. FINDINGS: Brain: No evidence of acute infarction, hemorrhage, hydrocephalus, extra-axial collection or mass lesion/mass effect. Vascular: No hyperdense vessel or unexpected calcification. Skull: Normal. Negative for fracture or focal lesion. Sinuses/Orbits: No acute finding. Other: None. IMPRESSION: Normal head CT. Electronically Signed   By: Lupita RaiderJames  Green Jr, M.D.   On: 02/14/2016 17:16   [place] Renal  Result Date: 02/14/2016 CLINICAL DATA:  Acute kidney injury. EXAM: RENAL / URINARY TRACT ULTRASOUND COMPLETE COMPARISON:  CT scan 05/12/2007 FINDINGS: Right Kidney: Length: 10.5 cm. Echogenicity within normal limits. No mass or hydronephrosis visualized. Left Kidney: Length: 11.1 cm. Echogenicity within normal limits. No mass or hydronephrosis visualized. Bladder: Appears normal for degree of bladder distention. Prevoid residual calculated at 9:00 55 cc. Patient was unable to void so postvoid residual could not be determined. IMPRESSION: No hydronephrosis. Electronically Signed   By: Kennith CenterEric  Mansell M.D.   On: 02/14/2016 19:44   Dg Chest Port 1 View  Result Date: 02/15/2016 CLINICAL DATA:  Hypoxia EXAM: PORTABLE CHEST - 1 VIEW COMPARISON:  02/14/2016 FINDINGS: Low lung volumes. No focal infiltrate. Mild subsegmental  atelectasis in the lung bases stable. Heart size  and mediastinal contours are within normal limits. No effusion. Visualized bones unremarkable. IMPRESSION: 1. Low volumes.  No acute disease. Electronically Signed   By: Corlis Leak M.D.   On: 02/15/2016 09:03   Dg Chest Port 1 View  Result Date: 02/14/2016 CLINICAL DATA:  Altered mental status. EXAM: PORTABLE CHEST 1 VIEW COMPARISON:  Radiographs of November 23, 2014. FINDINGS: Stable cardiomediastinal silhouette. Hypoinflation of the lungs is noted with mild bibasilar subsegmental atelectasis. No pneumothorax or significant pleural effusion is noted. Bony thorax is intact. IMPRESSION: Hypoinflation of the lungs with mild bibasilar subsegmental atelectasis. Electronically Signed   By: Lupita Raider, M.D.   On: 02/14/2016 15:22      Scheduled Meds: . benztropine  1 mg Oral BID  . divalproex  750 mg Oral Daily  . mouth rinse  15 mL Mouth Rinse BID  . metoprolol succinate  25 mg Oral Daily  . OLANZapine  15 mg Oral QHS  . pantoprazole  40 mg Oral Daily  . piperacillin-tazobactam (ZOSYN)  IV  3.375 g Intravenous Q8H  . tamsulosin  0.4 mg Oral QPC supper  . vancomycin  1,000 mg Intravenous Q24H   Continuous Infusions: . sodium chloride 75 mL/hr at 02/14/16 2053     LOS: 1 day    Time spent in minutes: 35    Ivaan Liddy, MD [facility] Pager: [email or web address] Password [email or web address] 02/15/2016, 1:51 PM

## 2016-02-16 ENCOUNTER — Encounter (HOSPITAL_COMMUNITY): Payer: Self-pay

## 2016-02-16 LAB — BASIC METABOLIC PANEL
ANION GAP: 10 (ref 5–15)
BUN: 20 mg/dL (ref 6–20)
CALCIUM: 8.4 mg/dL — AB (ref 8.9–10.3)
CO2: 23 mmol/L (ref 22–32)
Chloride: 109 mmol/L (ref 101–111)
Creatinine, Ser: 1.62 mg/dL — ABNORMAL HIGH (ref 0.61–1.24)
GFR, EST AFRICAN AMERICAN: 54 mL/min — AB (ref >60)
GFR, EST NON AFRICAN AMERICAN: 47 mL/min — AB (ref >60)
GLUCOSE: 80 mg/dL (ref 65–99)
Potassium: 3.7 mmol/L (ref 3.5–5.1)
SODIUM: 142 mmol/L (ref 135–145)

## 2016-02-16 LAB — CBC
HCT: 26 % — ABNORMAL LOW (ref 39.0–52.0)
HEMOGLOBIN: 8.6 g/dL — AB (ref 13.0–17.0)
MCH: 34.3 pg — ABNORMAL HIGH (ref 26.0–34.0)
MCHC: 33.1 g/dL (ref 30.0–36.0)
MCV: 103.6 fL — ABNORMAL HIGH (ref 78.0–100.0)
Platelets: 46 10*3/uL — ABNORMAL LOW (ref 150–400)
RBC: 2.51 MIL/uL — ABNORMAL LOW (ref 4.22–5.81)
RDW: 17.2 % — ABNORMAL HIGH (ref 11.5–15.5)
WBC: 6 10*3/uL (ref 4.0–10.5)

## 2016-02-16 NOTE — Evaluation (Signed)
Physical Therapy Evaluation Patient Details Name: Carl Mcdonald MRN: [id number] DOB: [birth year] Today's Date: 02/16/2016   History of Present Illness  Carl Mcdonald a 56 y.o.malewho is brought in by EMS from home for confusion. According to history obtained by the EMS, the patient has been confused at home for past 2 days. He was noted to have a pulse ox of 88% when EMS arrived to pick him up. Encephalopathy due to AKI, and ?febrile illness (hypoxia and fever resolved), acute renal failure due to urinary retention, thrombocytopenia; pertinent history of schizophrenia, concern that psych meds may be contributing to thrombocytopenia.  Clinical Impression   Pt admitted with above diagnosis. Pt currently with functional limitations due to the deficits listed below (see PT Problem List). Presents with dyscoordinated, "jerking' movements, generalized weakness, and with decr functional mobility; this is quite a functional decline -- per Alcario Drought, RN, pt walked without an assistive device PTA; Will need more info re: home situation and available assist at home; for pt's with schizophrenia, I very much value getting home to familiar environment, routine, and caregivers;  Still at current functional level, home seems unsustainable; Will request IP Rehab Screen;  Pt will benefit from skilled PT to increase their independence and safety with mobility to allow discharge to the venue listed below.       Follow Up Recommendations Post-acute Rehabilitation    Equipment Recommendations  Other (comment) (To be determined)    Recommendations for Other Services OT consult     Precautions / Restrictions Precautions Precautions: Fall      Mobility  Bed Mobility Overal bed mobility: Needs Assistance Bed Mobility: Supine to Sit     Supine to sit: Mod assist     General bed mobility comments: Mod assist to clear feet from EOB; Mod handheld assist and support at back to elevate trunk to  sit  Transfers Overall transfer level: Needs assistance Equipment used: 1 person hand held assist (and second person for safety) Transfers: Sit to/from [employer] Sit to Stand: Mod assist Stand pivot transfers: Mod assist       General transfer comment: Mod assist to power up to stand; steadying assist also given bilaterally at trunk with gait belt; Good effort with power up; Once weight shift to transition to chair, pt reached down to bed rail and to armrest to steady self; took steps to chair once he had support under both hands  Ambulation/Gait                Stairs            Wheelchair Mobility    Modified Rankin (Stroke Patients Only)       Balance Overall balance assessment: Needs assistance   Sitting balance-Leahy Scale: Zero Sitting balance - Comments: Cues to hinge forward at hips and push feet down into floor to facilitate forward weight shift; Still, pt required constant hands-on support to keep from losing balance backwards due to tendency to lean posteriorly     Standing balance-Leahy Scale: Poor                               Pertinent Vitals/Pain Pain Assessment: No/denies pain    Home Living Family/patient expects to be discharged to:: Private residence Living Arrangements: Other relatives Available Help at Discharge: Family             Additional Comments: Unable to get details of home situation  from patient; need more details    Prior Function           Comments: Did not get reliable information re: PLOF from pt     Hand Dominance        Extremity/Trunk Assessment   Upper Extremity Assessment Upper Extremity Assessment: Generalized weakness (Noted occasional "jerking" motions bil UEs)    Lower Extremity Assessment Lower Extremity Assessment: Generalized weakness       Communication   Communication: Expressive difficulties  Cognition Arousal/Alertness: Awake/alert;Lethargic (Much more  awake once sitting up in chair) Behavior During Therapy: WFL for tasks assessed/performed Overall Cognitive Status: No family/caregiver present to determine baseline cognitive functioning                      General Comments General comments (skin integrity, edema, etc.): IV inadvertently came out during transfer; help pressure and notified RN    Exercises     Assessment/Plan    PT Assessment Patient needs continued PT services  PT Problem List Decreased strength;Decreased activity tolerance;Decreased balance;Decreased mobility;Decreased coordination;Decreased cognition;Decreased knowledge of use of DME;Decreased safety awareness;Decreased knowledge of precautions;Impaired tone          PT Treatment Interventions DME instruction;Gait training;Stair training;Functional mobility training;Therapeutic activities;Therapeutic exercise;Balance training;Neuromuscular re-education;Cognitive remediation;Patient/family education    PT Goals (Current goals can be found in the Care Plan section)  Acute Rehab PT Goals Patient Stated Goal: Did not state, but seemed pleased to be OOB PT Goal Formulation: Patient unable to participate in goal setting Time For Goal Achievement: 03/01/16 Potential to Achieve Goals: Good    Frequency Min 3X/week   Barriers to discharge   Need more information re: PLOF and availale home assist    Co-evaluation               End of Session Equipment Utilized During Treatment: Gait belt Activity Tolerance: Patient tolerated treatment well Patient left: in chair;with call bell/phone within reach;with chair alarm set Nurse Communication: Mobility status (and IV out)         Time: [id number] PT Time Calculation (min) (ACUTE ONLY): 33 min   Charges:   PT Evaluation $PT Eval Moderate Complexity: 1 Procedure PT Treatments $Therapeutic Activity: 8-22 mins   PT G CodesLevi Aland:        Jaquanna Ballentine H Kahlia Lagunes 02/16/2016, 5:52 PM  Van ClinesHolly Alleigh Mollica, PT  Acute  Rehabilitation Services Pager [phone] Office [phone]

## 2016-02-16 NOTE — Progress Notes (Signed)
Rehab Admissions Coordinator Note:  Patient was screened by Trish MageLogue, Tomoki Lucken M for appropriateness for an Inpatient Acute Rehab Consult.  At this time, we are recommending Inpatient Rehab consult versus SNF placement.  Please order rehab consult if MD feels that it is appropriate.  Trish MageLogue, Zarriah Starkel M 02/16/2016, 7:12 PM  I can be reached at [phone].

## 2016-02-16 NOTE — Progress Notes (Signed)
PROGRESS NOTE    Carl Mcdonald  ZOX:[id number] DOB: February 27, 1960 DOA: 02/14/2016  PCP: No PCP Per Patient   Brief Narrative:  Carl Mcdonald is a 56 y.o. male who is brought in by EMS from home for confusion. According to history obtained by the EMS, the patient has been confused at home for past 2 days. He was noted to have a pulse ox of 88% when EMS arrived to pick him up. The patient was too confused to give a history.  Per caretaker who is here today, the patient is usually confused at baseline and has schizophrenia. He has been confused for about 2-3 days and "sluggish" for about 1-2 days. He does smoke but does not drink alcohol.   Subjective: No complaints today.    Assessment & Plan:   Principal Problem:   Acute encephalopathy - possibly due to AKI and ? Underlying infection as he was mildly febrile on initial presentation - CT head and MRI normal - Obtained ammonia level- only 39 - Depakote level normal - Would like to have obtained LP to rule out encephalitis however platelets have been too low    Acute renal failure  - due to urinary retention>> although patient was urinating, he is found to later have about 1 L Urine in his bladder- Foley has been placed- started flomax -- prerenal >>obtained urine sodium- found to be low and consistent with dehydration - UA unrevealing -  renal ultrasound normal - Cr improving steadily - will give voiding trial in AM  Metabolic alkalosis -pH mildly elevated at 7.44, serum bicarbonate is normal   Low Grade fever -? Underlying infectious process - lactic acid normal - UA negative-chest x-ray clear but hypoxic on admission-  flu PCR negative - repeat CXR after hydrating him still negative for infiltrates  - hypoxia and fever resolved on day 2 - cultures negative- stop antibiotics today and follow for recurrence of fevers     Anemia -  anemia panel consistent with AOCD - Hemoccult pending -As bilirubin was elevated,  obtained LDH and haptoglobin levels - not hemolysing    Thrombocytopenia  - Difficult to tell if this is acute or chronic - both Zyprexa and Depakote can cause this but not safe to hold in patient with schizophrenia - follow- If platelets continue to drop, will need to d/c current psych meds but will need psych input first - avoiding blood thinners  Hypotension - given 2 L IVF in ER with improvement in BP- possibly related to dehydration  Elevated bilirubin -AST normal, ALT is low -Alkaline phosphatase is normal -Following   Schizophrenia -  Well controlled on Depakote & Zyprexa - lives with brother in law who is his primary caretaker   DVT prophylaxis: SCDs Code Status: Full code Family Communication: POA- brother in law/ caretaker Disposition Plan:  Home when stable Consultants:    Procedures:    Antimicrobials:  Anti-infectives    Start     Dose/Rate Route Frequency Ordered Stop   02/15/16 1500  vancomycin (VANCOCIN) IVPB 750 mg/150 ml premix  Status:  Discontinued     750 mg 150 mL/hr over 60 Minutes Intravenous Every 24 hours 02/14/16 1715 02/15/16 1019   02/15/16 1500  vancomycin (VANCOCIN) IVPB 1000 mg/200 mL premix  Status:  Discontinued     1,000 mg 200 mL/hr over 60 Minutes Intravenous Every 24 hours 02/15/16 1019 02/16/16 0728   02/14/16 2300  piperacillin-tazobactam (ZOSYN) IVPB 3.375 g  Status:  Discontinued  3.375 g 12.5 mL/hr over 240 Minutes Intravenous Every 8 hours 02/14/16 1715 02/16/16 0728   02/14/16 1430  piperacillin-tazobactam (ZOSYN) IVPB 3.375 g     3.375 g 100 mL/hr over 30 Minutes Intravenous  Once 02/14/16 1428 02/14/16 1526   02/14/16 1430  vancomycin (VANCOCIN) IVPB 1000 mg/200 mL premix     1,000 mg 200 mL/hr over 60 Minutes Intravenous  Once 02/14/16 1428 02/14/16 1556       Objective: Vitals:   02/15/16 1206 02/15/16 1518 02/15/16 2035 02/16/16 0429  BP: 113/73 (!) 120/56 129/77 (!) 106/52  Pulse: 87 81 77 71  Resp: 18  19 20 18   Temp: 98.5 F (36.9 C) 99 F (37.2 C) 97.5 F (36.4 C) 98.2 F (36.8 C)  TempSrc: Oral Oral  Oral  SpO2: 98% 95% 99% 92%  Weight: 88.9 kg (195 lb 14.4 oz)     Height:        Intake/Output Summary (Last 24 hours) at 02/16/16 0859 Last data filed at 02/16/16 0648  Gross per 24 hour  Intake             2210 ml  Output              550 ml  Net             1660 ml   Filed Weights   02/14/16 2058 02/15/16 0900 02/15/16 1206  Weight: 87.7 kg (193 lb 5.5 oz) 90.1 kg (198 lb 10.2 oz) 88.9 kg (195 lb 14.4 oz)    Examination: General exam: Appears comfortable - confused to time and place HEENT: PERRLA,  no sclera icterus or thrush-  Respiratory system: Clear to auscultation. Respiratory effort normal. Cardiovascular system: S1 & S2 heard, RRR.  No murmurs  Gastrointestinal system: Abdomen soft, non-tender, nondistended. Normal bowel sound. No organomegaly Central nervous system: Alert and oriented. No focal neurological deficits. Extremities: No cyanosis, clubbing or edema Skin: No rashes or ulcers Psychiatry:  Mood & affect appropriate.     Data Reviewed: I have personally reviewed following labs and imaging studies  CBC:  Recent Labs Lab 02/14/16 1440 02/15/16 0312 02/16/16 0529  WBC 7.8 5.7 6.0  NEUTROABS 5.2  --   --   HGB 9.1* 9.2* 8.6*  HCT 27.7* 27.8* 26.0*  MCV 103.4* 102.2* 103.6*  PLT 60* 49* 46*   Basic Metabolic Panel:  Recent Labs Lab 02/14/16 1440 02/14/16 2131  NA 137 141  K 4.2 4.0  CL 103 108  CO2 23 23  GLUCOSE 90 93  BUN 32* 30*  CREATININE 4.01* 3.31*  CALCIUM 8.4* 8.2*   GFR: Estimated Creatinine Clearance: 28.3 mL/min (by C-G formula based on SCr of 3.31 mg/dL (H)). Liver Function Tests:  Recent Labs Lab 02/14/16 1440 02/14/16 2131  AST 36 63*  ALT 10* 10*  ALKPHOS 60 57  BILITOT 1.6* 2.0*  PROT 5.8* 6.0*  ALBUMIN 2.9* 2.8*   No results for input(s): LIPASE, AMYLASE in the last 168 hours.  Recent Labs Lab  02/14/16 1816  AMMONIA 39*   Coagulation Profile:  Recent Labs Lab 02/14/16 2131  INR 1.50   Cardiac Enzymes: No results for input(s): CKTOTAL, CKMB, CKMBINDEX, TROPONINI in the last 168 hours. BNP (last 3 results) No results for input(s): PROBNP in the last 8760 hours. HbA1C: No results for input(s): HGBA1C in the last 72 hours. CBG: No results for input(s): GLUCAP in the last 168 hours. Lipid Profile: No results for input(s): CHOL, HDL, LDLCALC, TRIG,  CHOLHDL, LDLDIRECT in the last 72 hours. Thyroid Function Tests: No results for input(s): TSH, T4TOTAL, FREET4, T3FREE, THYROIDAB in the last 72 hours. Anemia Panel:  Recent Labs  02/14/16 1818 02/14/16 1819 02/14/16 2131  VITAMINB12 385  --  305  FOLATE  --  12.8 9.4  FERRITIN 220  --  269  TIBC 314  --  358  IRON 38*  --  24*  RETICCTPCT 1.6  --  2.4   Urine analysis:    Component Value Date/Time   COLORURINE AMBER (A) 02/14/2016 1638   APPEARANCEUR CLEAR 02/14/2016 1638   APPEARANCEUR Clear 07/28/2011 1849   LABSPEC 1.009 02/14/2016 1638   LABSPEC 1.004 07/28/2011 1849   PHURINE 5.0 02/14/2016 1638   GLUCOSEU NEGATIVE 02/14/2016 1638   GLUCOSEU Negative 07/28/2011 1849   HGBUR NEGATIVE 02/14/2016 1638   BILIRUBINUR NEGATIVE 02/14/2016 1638   BILIRUBINUR Negative 07/28/2011 1849   KETONESUR NEGATIVE 02/14/2016 1638   PROTEINUR NEGATIVE 02/14/2016 1638   NITRITE NEGATIVE 02/14/2016 1638   LEUKOCYTESUR NEGATIVE 02/14/2016 1638   LEUKOCYTESUR Negative 07/28/2011 1849   Sepsis Labs: @LABRCNTIP (procalcitonin:4,lacticidven:4) ) Recent Results (from the past 240 hour(s))  Blood Culture (routine x 2)     Status: None (Preliminary result)   Collection Time: 02/14/16  2:40 PM  Result Value Ref Range Status   Specimen Description BLOOD LEFT HAND  Final   Special Requests BOTTLES DRAWN AEROBIC AND ANAEROBIC 5CC  Final   Culture NO GROWTH < 24 HOURS  Final   Report Status PENDING  Incomplete  Blood Culture  (routine x 2)     Status: None (Preliminary result)   Collection Time: 02/14/16  3:11 PM  Result Value Ref Range Status   Specimen Description BLOOD RIGHT HAND  Final   Special Requests BOTTLES DRAWN AEROBIC ONLY 10CC  Final   Culture NO GROWTH < 24 HOURS  Final   Report Status PENDING  Incomplete  Urine culture     Status: None   Collection Time: 02/14/16  4:38 PM  Result Value Ref Range Status   Specimen Description URINE, CATHETERIZED  Final   Special Requests NONE  Final   Culture NO GROWTH  Final   Report Status 02/15/2016 FINAL  Final  MRSA PCR Screening     Status: None   Collection Time: 02/14/16 10:34 PM  Result Value Ref Range Status   MRSA by PCR NEGATIVE NEGATIVE Final    Comment:        The GeneXpert MRSA Assay (FDA approved for NASAL specimens only), is one component of a comprehensive MRSA colonization surveillance program. It is not intended to diagnose MRSA infection nor to guide or monitor treatment for MRSA infections.          Radiology Studies: Ct Head Wo Contrast  Result Date: 02/14/2016 CLINICAL DATA:  Dizziness.  Altered mental status. EXAM: CT HEAD WITHOUT CONTRAST TECHNIQUE: Contiguous axial images were obtained from the base of the skull through the vertex without intravenous contrast. COMPARISON:  CT scan of July 24, 2011. FINDINGS: Brain: No evidence of acute infarction, hemorrhage, hydrocephalus, extra-axial collection or mass lesion/mass effect. Vascular: No hyperdense vessel or unexpected calcification. Skull: Normal. Negative for fracture or focal lesion. Sinuses/Orbits: No acute finding. Other: None. IMPRESSION: Normal head CT. Electronically Signed   By: Lupita Raider, M.D.   On: 02/14/2016 17:16   Mr Brain Wo Contrast  Result Date: 02/15/2016 CLINICAL DATA:  Confusion.  Symptoms for 2 days. EXAM: MRI HEAD WITHOUT CONTRAST TECHNIQUE:  Multiplanar, multiecho pulse sequences of the brain and surrounding structures were obtained without  intravenous contrast. COMPARISON:  CT head 02/14/2016. FINDINGS: The patient was unable to remain motionless for the exam. Small or subtle lesions could be overlooked. Brain: No evidence for acute infarction, hemorrhage, mass lesion, hydrocephalus, or extra-axial fluid. Premature for age atrophy. Minor subcortical and periventricular T2 and FLAIR hyperintensities, likely chronic microvascular ischemic change. Vascular: Normal flow voids. Skull and upper cervical spine: Normal marrow signal. Sinuses/Orbits: Negative. Other: None. IMPRESSION: Atrophy. No acute intracranial findings. Similar appearance to prior CT scan. Electronically Signed   By: Elsie StainJohn T Curnes M.D.   On: 02/15/2016 14:19   [place] Renal  Result Date: 02/14/2016 CLINICAL DATA:  Acute kidney injury. EXAM: RENAL / URINARY TRACT ULTRASOUND COMPLETE COMPARISON:  CT scan 05/12/2007 FINDINGS: Right Kidney: Length: 10.5 cm. Echogenicity within normal limits. No mass or hydronephrosis visualized. Left Kidney: Length: 11.1 cm. Echogenicity within normal limits. No mass or hydronephrosis visualized. Bladder: Appears normal for degree of bladder distention. Prevoid residual calculated at 9:00 55 cc. Patient was unable to void so postvoid residual could not be determined. IMPRESSION: No hydronephrosis. Electronically Signed   By: Kennith CenterEric  Mansell M.D.   On: 02/14/2016 19:44   Dg Chest Port 1 View  Result Date: 02/15/2016 CLINICAL DATA:  Hypoxia EXAM: PORTABLE CHEST - 1 VIEW COMPARISON:  02/14/2016 FINDINGS: Low lung volumes. No focal infiltrate. Mild subsegmental atelectasis in the lung bases stable. Heart size and mediastinal contours are within normal limits. No effusion. Visualized bones unremarkable. IMPRESSION: 1. Low volumes.  No acute disease. Electronically Signed   By: Corlis Leak  Hassell M.D.   On: 02/15/2016 09:03   Dg Chest Port 1 View  Result Date: 02/14/2016 CLINICAL DATA:  Altered mental status. EXAM: PORTABLE CHEST 1 VIEW COMPARISON:  Radiographs of  November 23, 2014. FINDINGS: Stable cardiomediastinal silhouette. Hypoinflation of the lungs is noted with mild bibasilar subsegmental atelectasis. No pneumothorax or significant pleural effusion is noted. Bony thorax is intact. IMPRESSION: Hypoinflation of the lungs with mild bibasilar subsegmental atelectasis. Electronically Signed   By: Lupita RaiderJames  Green Jr, M.D.   On: 02/14/2016 15:22      Scheduled Meds: . benztropine  1 mg Oral BID  . divalproex  750 mg Oral Daily  . mouth rinse  15 mL Mouth Rinse BID  . metoprolol succinate  25 mg Oral Daily  . OLANZapine  15 mg Oral QHS  . pantoprazole  40 mg Oral Daily  . tamsulosin  0.4 mg Oral QPC supper   Continuous Infusions: . sodium chloride 75 mL/hr at 02/14/16 2053     LOS: 2 days    Time spent in minutes: 35    Carmela Piechowski, MD [facility] Pager: [email or web address] Password [email or web address] 02/16/2016, 8:59 AM

## 2016-02-17 ENCOUNTER — Encounter (HOSPITAL_COMMUNITY): Payer: Self-pay

## 2016-02-17 DIAGNOSIS — R339 Retention of urine, unspecified: Secondary | ICD-10-CM

## 2016-02-17 DIAGNOSIS — D62 Acute posthemorrhagic anemia: Secondary | ICD-10-CM

## 2016-02-17 DIAGNOSIS — G9340 Encephalopathy, unspecified: Secondary | ICD-10-CM

## 2016-02-17 DIAGNOSIS — D696 Thrombocytopenia, unspecified: Secondary | ICD-10-CM

## 2016-02-17 DIAGNOSIS — F209 Schizophrenia, unspecified: Secondary | ICD-10-CM

## 2016-02-17 DIAGNOSIS — N179 Acute kidney failure, unspecified: Secondary | ICD-10-CM

## 2016-02-17 LAB — CBC
HEMATOCRIT: 22 % — AB (ref 39.0–52.0)
HEMOGLOBIN: 7.2 g/dL — AB (ref 13.0–17.0)
MCH: 34 pg (ref 26.0–34.0)
MCHC: 32.7 g/dL (ref 30.0–36.0)
MCV: 103.8 fL — ABNORMAL HIGH (ref 78.0–100.0)
Platelets: 44 10*3/uL — ABNORMAL LOW (ref 150–400)
RBC: 2.12 MIL/uL — ABNORMAL LOW (ref 4.22–5.81)
RDW: 17.3 % — ABNORMAL HIGH (ref 11.5–15.5)
WBC: 5 10*3/uL (ref 4.0–10.5)

## 2016-02-17 LAB — BASIC METABOLIC PANEL
ANION GAP: 5 (ref 5–15)
BUN: 14 mg/dL (ref 6–20)
CO2: 25 mmol/L (ref 22–32)
Calcium: 8.1 mg/dL — ABNORMAL LOW (ref 8.9–10.3)
Chloride: 112 mmol/L — ABNORMAL HIGH (ref 101–111)
Creatinine, Ser: 1.24 mg/dL (ref 0.61–1.24)
GFR calc Af Amer: >60 mL/min (ref >60)
Glucose, Bld: 82 mg/dL (ref 65–99)
POTASSIUM: 3.6 mmol/L (ref 3.5–5.1)
Sodium: 142 mmol/L (ref 135–145)

## 2016-02-17 LAB — PREPARE RBC (CROSSMATCH)

## 2016-02-17 LAB — ABO/RH: ABO/RH(D): O POS

## 2016-02-17 LAB — HEMOGLOBIN AND HEMATOCRIT, BLOOD
HCT: 29.1 % — ABNORMAL LOW (ref 39.0–52.0)
HEMATOCRIT: 22.3 % — AB (ref 39.0–52.0)
HEMOGLOBIN: 7.3 g/dL — AB (ref 13.0–17.0)
HEMOGLOBIN: 9.9 g/dL — AB (ref 13.0–17.0)

## 2016-02-17 MED ORDER — TAB-A-VITE/IRON PO TABS
1.0000 | ORAL_TABLET | Freq: Every day | ORAL | Status: DC
  Administered 2016-02-17 – 2016-02-19 (×3): 1 via ORAL
  Filled 2016-02-17 (×3): qty 1

## 2016-02-17 MED ORDER — MEGESTROL ACETATE 400 MG/10ML PO SUSP
400.0000 mg | Freq: Two times a day (BID) | ORAL | Status: DC
  Administered 2016-02-17 – 2016-02-19 (×5): 400 mg via ORAL
  Filled 2016-02-17 (×5): qty 10

## 2016-02-17 MED ORDER — ENSURE ENLIVE PO LIQD
237.0000 mL | Freq: Three times a day (TID) | ORAL | Status: DC
  Administered 2016-02-17 – 2016-02-18 (×4): 237 mL via ORAL

## 2016-02-17 MED ORDER — BOOST / RESOURCE BREEZE PO LIQD: 1.0000 | Freq: Three times a day (TID) | ORAL | Status: DC

## 2016-02-17 MED ORDER — SODIUM CHLORIDE 0.9 % IV SOLN
Freq: Once | INTRAVENOUS | Status: AC
  Administered 2016-02-17: 11:00:00 via INTRAVENOUS

## 2016-02-17 NOTE — Progress Notes (Signed)
CSW following for potential SNF placement- discussed with pt and caregiver, [name], and they are in agreement for SNF if inpatient rehab is not an option.  Referral sent out for SNFs.   Per MD pt medically stable today- CSW has submitted for [facility] but it has gone to manual review due to patient mental health diagnosis- very unlikely pt could DC to SNF today because [facility] office is closed for the holiday- earliest SNF DC would be tomorrow if [facility] is approved.  CSW will continue to follow  Burna SisJenna H. Marguita Venning, LCSW Clinical Social Worker [phone]

## 2016-02-17 NOTE — Consult Note (Signed)
Physical Medicine and Rehabilitation Consult Reason for Consult: Acute encephalopathy Referring Physician: [facility]   HPI: Carl Mcdonald is a 56 y.o. right handed male with history of schizophrenia. Pt being transfused on evaluation. Per chart review and brother in law, patient has been separated from his wife for approximately one year and currently lives with his sister and brother-in-law in [place]. They can only provider supervision at discharge. Brother-in-law does report a recent fall approximately 2 weeks ago in the front yard but denied any loss of consciousness. He presented 02/14/2016 with altered mental status 2 days. Pulse ox of 88% when EMS arrived to pick him up. CT/MRI negative for acute changes. Creatinine on admission of 4.01. Noted urinary retention. Renal ultrasound negative hydronephrosis. Chest x-ray negative. Alcohol level and urine drug screen negative. Lactic acid normal. Blood cultures negative. Renal function responded well to IV fluids creatinine 1.24. Consideration being made for psychiatric consultation. Tolerating a regular diet. Physical therapy evaluation completed with recommendations of physical medicine rehabilitation consult.   Review of Systems  Constitutional: Negative for chills and fever.  HENT: Negative for hearing loss and tinnitus.   Eyes: Negative for blurred vision and double vision.  Respiratory: Negative for cough and shortness of breath.   Cardiovascular: Negative for chest pain, palpitations and leg swelling.  Gastrointestinal: Positive for constipation. Negative for nausea and vomiting.  Genitourinary: Negative for dysuria, flank pain and hematuria.  Musculoskeletal: Positive for myalgias.       Recent  Skin: Negative for rash.  Neurological: Positive for weakness. Negative for seizures.  Psychiatric/Behavioral: The patient has insomnia.        Schizophrenia  All other systems reviewed and are negative.  History  reviewed. No pertinent past medical history., unable to obtain from patient History reviewed. No pertinent surgical history., unable to obtain from patient History reviewed. No pertinent family history., unable to obtain from patient Social History:  reports that he has quit smoking. He has never used smokeless tobacco. His alcohol and drug histories are not on file. Allergies:  Allergies  Allergen Reactions  . Lithium Other (See Comments)    Agitation/ hallucinations   Medications Prior to Admission  Medication Sig Dispense Refill  . albuterol (PROVENTIL HFA;VENTOLIN HFA) 108 (90 Base) MCG/ACT inhaler Inhale 2 puffs into the lungs every 6 (six) hours as needed for wheezing or shortness of breath.    Marland Kitchen aspirin 325 MG tablet Take 325 mg by mouth daily as needed (pain).    . benztropine (COGENTIN) 1 MG tablet Take 1 mg by mouth See admin instructions. Take 1 tablet (1 mg) by mouth every morning and at 3pm    . divalproex (DEPAKOTE ER) 250 MG 24 hr tablet Take 250 mg by mouth See admin instructions. Take 1 tablet (250 mg) by mouth daily at bedtime - with a 500 mg tablet for a 750 mg dose    . divalproex (DEPAKOTE ER) 500 MG 24 hr tablet Take 500 mg by mouth See admin instructions. Take 1 tablet (500 mg) by mouth daily at bedtime - with a 250 mg tablet for a 750 mg dose    . fenofibrate (TRICOR) 145 MG tablet Take 145 mg by mouth daily.    . Fluticasone-Salmeterol (ADVAIR) 250-50 MCG/DOSE AEPB Inhale 1 puff into the lungs 2 (two) times daily.    . meloxicam (MOBIC) 15 MG tablet Take 15 mg by mouth at bedtime.     . metoprolol succinate (TOPROL-XL) 25 MG  24 hr tablet Take 25 mg by mouth daily at 3 pm.     . OLANZapine (ZYPREXA) 15 MG tablet Take 30 mg by mouth at bedtime.     Marland Kitchen omeprazole (PRILOSEC) 40 MG capsule Take 40 mg by mouth daily.    . simvastatin (ZOCOR) 40 MG tablet Take 40 mg by mouth at bedtime.     Marland Kitchen tiZANidine (ZANAFLEX) 4 MG tablet Take 8 mg by mouth at bedtime.        Home: Home Living Family/patient expects to be discharged to:: Private residence Living Arrangements: Other relatives Available Help at Discharge: Family Additional Comments: Unable to get details of home situation from patient; need more details  Functional History: Prior Function Comments: Did not get reliable information re: PLOF from pt Functional Status:  Mobility: Bed Mobility Overal bed mobility: Needs Assistance Bed Mobility: Supine to Sit Supine to sit: Mod assist General bed mobility comments: Mod assist to clear feet from EOB; Mod handheld assist and support at back to elevate trunk to sit Transfers Overall transfer level: Needs assistance Equipment used: 1 person hand held assist (and second person for safety) Transfers: Sit to/from Stand, Stand Pivot Transfers Sit to Stand: Mod assist Stand pivot transfers: Mod assist General transfer comment: Mod assist to power up to stand; steadying assist also given bilaterally at trunk with gait belt; Good effort with power up; Once weight shift to transition to chair, pt reached down to bed rail and to armrest to steady self; took steps to chair once he had support under both hands      ADL:    Cognition: Cognition Overall Cognitive Status: No family/caregiver present to determine baseline cognitive functioning Orientation Level: Oriented to person, Disoriented to place, Disoriented to time, Disoriented to situation Cognition Arousal/Alertness: Awake/alert, Lethargic (Much more awake once sitting up in chair) Behavior During Therapy: [facility] for tasks assessed/performed Overall Cognitive Status: No family/caregiver present to determine baseline cognitive functioning  Blood pressure 106/68, pulse 83, temperature 98.2 F (36.8 C), resp. rate 18, height 6' (1.829 m), weight 88.9 kg (195 lb 14.4 oz), SpO2 93 %. Physical Exam  Vitals reviewed. Constitutional: He appears well-developed.  56 year old right-handed male  appearing older than documented age. Obese  HENT:  Head: Normocephalic and atraumatic.  Eyes: EOM are normal. Right eye exhibits no discharge. Left eye exhibits discharge.  Pupils reactive to light  Neck: Normal range of motion. Neck supple. No thyromegaly present.  Cardiovascular: Normal rate, regular rhythm and normal heart sounds.   Respiratory: Effort normal and breath sounds normal. No respiratory distress.  GI: Soft. Bowel sounds are normal. He exhibits no distension.  Musculoskeletal: He exhibits no edema or tenderness.  Neurological: He is alert.  He does make eye contact with examiner.  Needed multiple cues to state his age.  He could not give his date of birth. He was able to give the name of his brother-in-law who was in the room.  Followed simple commands Motor: 4+/5 throughout Resting tremor vs. asterixis  Skin: Skin is warm and dry.  Psychiatric: His affect is blunt. His speech is delayed. He is slowed. Cognition and memory are impaired.    Results for orders placed or performed during the hospital encounter of 02/14/16 (from the past 24 hour(s))  Basic metabolic panel     Status: Abnormal   Collection Time: 02/17/16  4:16 AM  Result Value Ref Range   Sodium 142 135 - 145 mmol/L   Potassium 3.6 3.5 - 5.1 mmol/L  Chloride 112 (H) 101 - 111 mmol/L   CO2 25 22 - 32 mmol/L   Glucose, Bld 82 65 - 99 mg/dL   BUN 14 6 - 20 mg/dL   Creatinine, Ser 4.091.24 0.61 - 1.24 mg/dL   Calcium 8.1 (L) 8.9 - 10.3 mg/dL   GFR calc non Af Amer >60 >60 mL/min   GFR calc Af Amer >60 >60 mL/min   Anion gap 5 5 - 15  CBC     Status: Abnormal   Collection Time: 02/17/16  4:16 AM  Result Value Ref Range   WBC 5.0 4.0 - 10.5 K/uL   RBC 2.12 (L) 4.22 - 5.81 MIL/uL   Hemoglobin 7.2 (L) 13.0 - 17.0 g/dL   HCT 81.122.0 (L) 91.439.0 - 78.252.0 %   MCV 103.8 (H) 78.0 - 100.0 fL   MCH 34.0 26.0 - 34.0 pg   MCHC 32.7 30.0 - 36.0 g/dL   RDW 95.617.3 (H) 21.311.5 - 08.615.5 %   Platelets 44 (L) 150 - 400 K/uL   Mr  Brain Wo Contrast  Result Date: 02/15/2016 CLINICAL DATA:  Confusion.  Symptoms for 2 days. EXAM: MRI HEAD WITHOUT CONTRAST TECHNIQUE: Multiplanar, multiecho pulse sequences of the brain and surrounding structures were obtained without intravenous contrast. COMPARISON:  CT head 02/14/2016. FINDINGS: The patient was unable to remain motionless for the exam. Small or subtle lesions could be overlooked. Brain: No evidence for acute infarction, hemorrhage, mass lesion, hydrocephalus, or extra-axial fluid. Premature for age atrophy. Minor subcortical and periventricular T2 and FLAIR hyperintensities, likely chronic microvascular ischemic change. Vascular: Normal flow voids. Skull and upper cervical spine: Normal marrow signal. Sinuses/Orbits: Negative. Other: None. IMPRESSION: Atrophy. No acute intracranial findings. Similar appearance to prior CT scan. Electronically Signed   By: Elsie StainJohn T Curnes M.D.   On: 02/15/2016 14:19    Assessment/Plan: Diagnosis: Acute encephalopathy Labs and images independently reviewed.  Records reviewed and summated above.  1. Does the need for close, 24 hr/day medical supervision in concert with the patient's rehab needs make it unreasonable for this patient to be served in a less intensive setting? Yes  2. Co-Morbidities requiring supervision/potential complications: schizophrenia (cont meds, may need adjustment), AKI (avoid nephrotoxic meds), ABLA (transfuse again if necessary to ensure appropriate perfusion for increased activity tolerance), Thrombocytopenia (< 60,000/mm3 no resistive exercise, < 20,000/mm3 AROM, maybe walking), Urinary retention (cont meds) 3. Due to safety, disease management, medication administration and patient education, does the patient require 24 hr/day rehab nursing? Yes 4. Does the patient require coordinated care of a physician, rehab nurse, PT (1-2 hrs/day, 5 days/week), OT (1-2 hrs/day, 5 days/week) and SLP (1-2 hrs/day, 5 days/week) to address  physical and functional deficits in the context of the above medical diagnosis(es)? Yes Addressing deficits in the following areas: balance, endurance, locomotion, strength, transferring, bowel/bladder control, bathing, dressing, toileting, cognition, language and psychosocial support 5. Can the patient actively participate in an intensive therapy program of at least 3 hrs of therapy per day at least 5 days per week? Yes 6. The potential for patient to make measurable gains while on inpatient rehab is excellent 7. Anticipated functional outcomes upon discharge from inpatient rehab are modified independent and supervision  with PT, modified independent and supervision with OT, supervision with SLP. 8. Estimated rehab length of stay to reach the above functional goals is: 13-16 days. 9. Does the patient have adequate social supports and living environment to accommodate these discharge functional goals? Yes 10. Anticipated D/C setting: Home 11. Anticipated  post D/C treatments: HH therapy and Home excercise program 12. Overall Rehab/Functional Prognosis: good  RECOMMENDATIONS: This patient's condition is appropriate for continued rehabilitative care in the following setting: [facility] after completion of medical workup and medically stable. Pt may need adjustment to meds and may improve, cognitively, after this.  Patient has agreed to participate in recommended program. Potentially Note that insurance prior authorization may be required for reimbursement for recommended care.  Comment: Rehab Admissions Coordinator to follow up.  Charlton Amor., PA-C 02/17/2016  Maryla Morrow, MD, [place] [facility]

## 2016-02-17 NOTE — Progress Notes (Signed)
Visited w/ pt and friend in rm. Pt requested Lord's prayer, which we all prayed together (adding prayers for his healing, hospital staff, peace). Provided emotional/spiritual support. Pt appreciative of visit. Chaplain available for f/u.   02/17/16 1100  Clinical Encounter Type  Visited With Patient and family together  Visit Type Initial;Spiritual support;Social support;Pre-op  Referral From Nurse  Spiritual Encounters  Spiritual Needs Prayer;Emotional  Stress Factors  Patient Stress Factors Health changes;Loss of control   Ephraim Hamburgerynthia A Chyrel Taha, [address]

## 2016-02-17 NOTE — Progress Notes (Signed)
Initial Nutrition Assessment  DOCUMENTATION CODES:   Not applicable  INTERVENTION:   -Ensure Enlive po TID, each supplement provides 350 kcal and 20 grams of protein -MVI daily -D/c Boost Breeze po TID, each supplement provides 250 kcal and 9 grams of protein  NUTRITION DIAGNOSIS:   Inadequate oral intake related to lethargy/confusion, poor appetite as evidenced by meal completion < 50%, per patient/family report.  GOAL:   Patient will meet greater than or equal to 90% of their needs  MONITOR:   PO intake, Supplement acceptance, Labs, Weight trends, I & O's, Skin  REASON FOR ASSESSMENT:   Malnutrition Screening Tool    ASSESSMENT:   Carl Mcdonald is a 56 y.o. male who is brought in by EMS from home for confusion. There is no family present to give history. I have tried the numbers on the face sheet and I am unable to reach family. The patient is confused and he knows only his name. According to history obtained by the EMS, the patient has been confused at home for past 2 days. He was noted to have a pulse ox of 88% when EMS arrived to pick him up. According to the nurse, the patient's wife was just in the ER earlier today but has left for home.  Pt admitted with acute encephalopathy.   Spoke with pt brother-in-law and caretaker at beside. He reports pt has had a very poor appetite over the past 3-4 weeks and estimates pt has lost 30# over this time period. UBW is around 170-180# at baseline and wt hx does not support statement of weight loss. Pt typically eats well (3-4 meals per day and several snacks). Pt reports he has no desire to eat.   Pt caretaker states that he did not eat any breakfast this AM and dislikes eggs. RD took breakfast order to input into meal ordering system. Pt also consumed a Boost and Ensure supplement prior to RD visit. Pt really enjoys the Ensure supplements. RD to order.   Case discussed with RN, who confirms that pt is not taking solid foods,  however, is consuming liquids well. Pt will d/c to CIR or SNF once PASSAR number is obtained.   Nutrition-Focused physical exam completed. Findings are no fat depletion, no muscle depletion, and no edema.   Discussed with pt caretaker importance of good meal and supplement intake to promote healing.   Labs reviewed.   Diet Order:  Diet regular Room service appropriate? Yes; Fluid consistency: Thin  Skin:  Wound (see comment) (DPTI rt hip)  Last BM:  02/17/16  Height:   Ht Readings from Last 1 Encounters:  02/14/16 6' (1.829 m)    Weight:   Wt Readings from Last 1 Encounters:  02/15/16 195 lb 14.4 oz (88.9 kg)    Ideal Body Weight:  80.9 kg  BMI:  Body mass index is 26.57 kg/m.  Estimated Nutritional Needs:   Kcal:  2200-2400  Protein:  105-120 grams  Fluid:  2.2-2.4 L  EDUCATION NEEDS:   Education needs addressed  Doranne Schmutz A. Mayford KnifeWilliams, RD, LDN, CDE Pager: [phone] After hours Pager: [phone]

## 2016-02-17 NOTE — Care Management Note (Addendum)
Case Management Note  Patient Details  Name: Carl Mcdonald MRN: [id number] Date of Birth: May 04, 1960  Subjective/Objective:             DC to CIR vs SNF. Awaiting for PASAR # per CSW note, likely to be received tomorrow since [facility]        Action/Plan:   02-19-16 DC to SNF today as facilitated by CSW.   Expected Discharge Date:                  Expected Discharge Plan:  Skilled Nursing Facility  In-House Referral:  Clinical Social Work  Discharge planning Services  CM Consult  Post Acute Care Choice:    Choice offered to:     DME Arranged:    DME Agency:     HH Arranged:    HH Agency:     Status of Service:  In process, will continue to follow  If discussed at Long Length of Stay Meetings, dates discussed:    Additional Comments:  Lawerance SabalDebbie Sherod Cisse, RN 02/17/2016, 11:04 AM

## 2016-02-17 NOTE — Progress Notes (Signed)
PROGRESS NOTE    Carl Mcdonald  ZOX:[id number] DOB: 1960/03/31 DOA: 02/14/2016  PCP: No PCP Per Patient   Brief Narrative:  Carl Mcdonald is a 56 y.o. male who is brought in by EMS from home for confusion. According to history obtained by the EMS, the patient has been confused at home for past 2 days. He was noted to have a pulse ox of 88% when EMS arrived to pick him up. The patient was too confused to give a history.  Per caretaker who came in the next day, the patient is usually confused at baseline and has schizophrenia. He has been confused for about 2-3 days and "sluggish" for about 1-2 days. He does smoke but does not drink alcohol.   Subjective: No complaints today.  His caretaker is concerned that he has been barely eating at home and is still not eating in the hospital .  Assessment & Plan:   Principal Problem:   Acute encephalopathy - possibly due to AKI and ? Underlying infection as he was mildly febrile on initial presentation - CT head and MRI normal - Obtained ammonia level- only 39 - Depakote level normal - Would like to have obtained LP to rule out encephalitis however platelets have been too low - mental status has returned to baseline per his caretaker- suspect azotemia may have caused the lethargy    Acute renal failure  - BUN 32 and Cr 4.01 today - due to urinary retention>> although patient was urinating, he was found to later have about 1 L Urine in his bladder- Foley placed- started Flomax- will give voiding trial today -- prerenal >>obtained urine sodium- found to be low and consistent with dehydration - UA unrevealing -  renal ultrasound normal - Cr improved to normal now   Metabolic alkalosis -pH mildly elevated at 7.44, serum bicarbonate is normal   Low Grade fever -? Underlying infectious process - lactic acid normal - UA negative-chest x-ray clear but hypoxic on admission-  flu PCR negative - repeat CXR after hydrating him still negative  for infiltrates  - hypoxia and fever resolved on day 2 - cultures negative- stop antibiotics today and follow for recurrence of fevers     Anemia -  anemia panel consistent with AOCD - Hemoccult pending -As bilirubin was elevated, obtained LDH and haptoglobin levels - not hemolysing - Hb has dropped to 7.3 with hydration- will transfuse 1 u PRBC today    Thrombocytopenia  - Difficult to tell if this is acute or chronic - both Zyprexa and Depakote can cause this but not safe to hold in patient with schizophrenia - follow- If platelets continue to drop, will need to d/c current psych meds but will need psych input first - avoiding blood thinners  Hypotension - given 2 L IVF in ER with improvement in BP- likely related to dehydration  Anorexia - lack of oral fluid or food intake- start Megace- cont to hydrate with IVF until oral intake improves  Elevated bilirubin -AST normal, ALT is low -Alkaline phosphatase is normal -Following   Schizophrenia -  Well controlled on Depakote & Zyprexa - lives with brother in law who is his primary caretaker   DVT prophylaxis: SCDs Code Status: Full code Family Communication: POA- brother in law/ caretaker Disposition Plan:  Home when stable Consultants:    Procedures:    Antimicrobials:  Anti-infectives    Start     Dose/Rate Route Frequency Ordered Stop   02/15/16 1500  vancomycin (  VANCOCIN) IVPB 750 mg/150 ml premix  Status:  Discontinued     750 mg 150 mL/hr over 60 Minutes Intravenous Every 24 hours 02/14/16 1715 02/15/16 1019   02/15/16 1500  vancomycin (VANCOCIN) IVPB 1000 mg/200 mL premix  Status:  Discontinued     1,000 mg 200 mL/hr over 60 Minutes Intravenous Every 24 hours 02/15/16 1019 02/16/16 0728   02/14/16 2300  piperacillin-tazobactam (ZOSYN) IVPB 3.375 g  Status:  Discontinued     3.375 g 12.5 mL/hr over 240 Minutes Intravenous Every 8 hours 02/14/16 1715 02/16/16 0728   02/14/16 1430  piperacillin-tazobactam  (ZOSYN) IVPB 3.375 g     3.375 g 100 mL/hr over 30 Minutes Intravenous  Once 02/14/16 1428 02/14/16 1526   02/14/16 1430  vancomycin (VANCOCIN) IVPB 1000 mg/200 mL premix     1,000 mg 200 mL/hr over 60 Minutes Intravenous  Once 02/14/16 1428 02/14/16 1556       Objective: Vitals:   02/16/16 2157 02/17/16 0555 02/17/16 1015 02/17/16 1055  BP: 138/87 106/68 117/65 113/67  Pulse: 77 83 82 79  Resp: 18 18 16 16   Temp: 99 F (37.2 C) 98.2 F (36.8 C) 98.5 F (36.9 C) 98.5 F (36.9 C)  TempSrc: Oral  Oral Oral  SpO2: 97% 93% 92% 93%  Weight:      Height:        Intake/Output Summary (Last 24 hours) at 02/17/16 1300 Last data filed at 02/17/16 1257  Gross per 24 hour  Intake             1185 ml  Output              550 ml  Net              635 ml   Filed Weights   02/14/16 2058 02/15/16 0900 02/15/16 1206  Weight: 87.7 kg (193 lb 5.5 oz) 90.1 kg (198 lb 10.2 oz) 88.9 kg (195 lb 14.4 oz)    Examination: General exam: Appears comfortable - confused to time and place HEENT: PERRLA,  no sclera icterus or thrush-  Respiratory system: Clear to auscultation. Respiratory effort normal. Cardiovascular system: S1 & S2 heard, RRR.  No murmurs  Gastrointestinal system: Abdomen soft, non-tender, nondistended. Normal bowel sound. No organomegaly Central nervous system: Alert and oriented. No focal neurological deficits. Extremities: No cyanosis, clubbing or edema Skin: No rashes or ulcers Psychiatry:  Mood & affect appropriate.     Data Reviewed: I have personally reviewed following labs and imaging studies  CBC:  Recent Labs Lab 02/14/16 1440 02/15/16 0312 02/16/16 0529 02/17/16 0416 02/17/16 0758  WBC 7.8 5.7 6.0 5.0  --   NEUTROABS 5.2  --   --   --   --   HGB 9.1* 9.2* 8.6* 7.2* 7.3*  HCT 27.7* 27.8* 26.0* 22.0* 22.3*  MCV 103.4* 102.2* 103.6* 103.8*  --   PLT 60* 49* 46* 44*  --    Basic Metabolic Panel:  Recent Labs Lab 02/14/16 1440 02/14/16 2131  02/16/16 0529 02/17/16 0416  NA 137 141 142 142  K 4.2 4.0 3.7 3.6  CL 103 108 109 112*  CO2 23 23 23 25   GLUCOSE 90 93 80 82  BUN 32* 30* 20 14  CREATININE 4.01* 3.31* 1.62* 1.24  CALCIUM 8.4* 8.2* 8.4* 8.1*   GFR: Estimated Creatinine Clearance: 75.6 mL/min (by C-G formula based on SCr of 1.24 mg/dL). Liver Function Tests:  Recent Labs Lab 02/14/16 1440 02/14/16 2131  AST 36 63*  ALT 10* 10*  ALKPHOS 60 57  BILITOT 1.6* 2.0*  PROT 5.8* 6.0*  ALBUMIN 2.9* 2.8*   No results for input(s): LIPASE, AMYLASE in the last 168 hours.  Recent Labs Lab 02/14/16 1816  AMMONIA 39*   Coagulation Profile:  Recent Labs Lab 02/14/16 2131  INR 1.50   Cardiac Enzymes: No results for input(s): CKTOTAL, CKMB, CKMBINDEX, TROPONINI in the last 168 hours. BNP (last 3 results) No results for input(s): PROBNP in the last 8760 hours. HbA1C: No results for input(s): HGBA1C in the last 72 hours. CBG: No results for input(s): GLUCAP in the last 168 hours. Lipid Profile: No results for input(s): CHOL, HDL, LDLCALC, TRIG, CHOLHDL, LDLDIRECT in the last 72 hours. Thyroid Function Tests: No results for input(s): TSH, T4TOTAL, FREET4, T3FREE, THYROIDAB in the last 72 hours. Anemia Panel:  Recent Labs  02/14/16 1818 02/14/16 1819 02/14/16 2131  VITAMINB12 385  --  305  FOLATE  --  12.8 9.4  FERRITIN 220  --  269  TIBC 314  --  358  IRON 38*  --  24*  RETICCTPCT 1.6  --  2.4   Urine analysis:    Component Value Date/Time   COLORURINE AMBER (A) 02/14/2016 1638   APPEARANCEUR CLEAR 02/14/2016 1638   APPEARANCEUR Clear 07/28/2011 1849   LABSPEC 1.009 02/14/2016 1638   LABSPEC 1.004 07/28/2011 1849   PHURINE 5.0 02/14/2016 1638   GLUCOSEU NEGATIVE 02/14/2016 1638   GLUCOSEU Negative 07/28/2011 1849   HGBUR NEGATIVE 02/14/2016 1638   BILIRUBINUR NEGATIVE 02/14/2016 1638   BILIRUBINUR Negative 07/28/2011 1849   KETONESUR NEGATIVE 02/14/2016 1638   PROTEINUR NEGATIVE 02/14/2016  1638   NITRITE NEGATIVE 02/14/2016 1638   LEUKOCYTESUR NEGATIVE 02/14/2016 1638   LEUKOCYTESUR Negative 07/28/2011 1849   Sepsis Labs: @LABRCNTIP (procalcitonin:4,lacticidven:4) ) Recent Results (from the past 240 hour(s))  Blood Culture (routine x 2)     Status: None (Preliminary result)   Collection Time: 02/14/16  2:40 PM  Result Value Ref Range Status   Specimen Description BLOOD LEFT HAND  Final   Special Requests BOTTLES DRAWN AEROBIC AND ANAEROBIC 5CC  Final   Culture NO GROWTH 3 DAYS  Final   Report Status PENDING  Incomplete  Blood Culture (routine x 2)     Status: None (Preliminary result)   Collection Time: 02/14/16  3:11 PM  Result Value Ref Range Status   Specimen Description BLOOD RIGHT HAND  Final   Special Requests BOTTLES DRAWN AEROBIC ONLY 10CC  Final   Culture NO GROWTH 3 DAYS  Final   Report Status PENDING  Incomplete  Urine culture     Status: None   Collection Time: 02/14/16  4:38 PM  Result Value Ref Range Status   Specimen Description URINE, CATHETERIZED  Final   Special Requests NONE  Final   Culture NO GROWTH  Final   Report Status 02/15/2016 FINAL  Final  MRSA PCR Screening     Status: None   Collection Time: 02/14/16 10:34 PM  Result Value Ref Range Status   MRSA by PCR NEGATIVE NEGATIVE Final    Comment:        The GeneXpert MRSA Assay (FDA approved for NASAL specimens only), is one component of a comprehensive MRSA colonization surveillance program. It is not intended to diagnose MRSA infection nor to guide or monitor treatment for MRSA infections.          Radiology Studies: Mr Brain Wo Contrast  Result Date: 02/15/2016  CLINICAL DATA:  Confusion.  Symptoms for 2 days. EXAM: MRI HEAD WITHOUT CONTRAST TECHNIQUE: Multiplanar, multiecho pulse sequences of the brain and surrounding structures were obtained without intravenous contrast. COMPARISON:  CT head 02/14/2016. FINDINGS: The patient was unable to remain motionless for the exam.  Small or subtle lesions could be overlooked. Brain: No evidence for acute infarction, hemorrhage, mass lesion, hydrocephalus, or extra-axial fluid. Premature for age atrophy. Minor subcortical and periventricular T2 and FLAIR hyperintensities, likely chronic microvascular ischemic change. Vascular: Normal flow voids. Skull and upper cervical spine: Normal marrow signal. Sinuses/Orbits: Negative. Other: None. IMPRESSION: Atrophy. No acute intracranial findings. Similar appearance to prior CT scan. Electronically Signed   By: Elsie Stain M.D.   On: 02/15/2016 14:19      Scheduled Meds: . benztropine  1 mg Oral BID  . divalproex  750 mg Oral Daily  . feeding supplement (ENSURE ENLIVE)  237 mL Oral TID BM  . mouth rinse  15 mL Mouth Rinse BID  . megestrol  400 mg Oral BID  . metoprolol succinate  25 mg Oral Daily  . OLANZapine  15 mg Oral QHS  . pantoprazole  40 mg Oral Daily  . tamsulosin  0.4 mg Oral QPC supper   Continuous Infusions: . sodium chloride 75 mL/hr at 02/17/16 0513     LOS: 3 days    Time spent in minutes: 35    Gabriele Loveland, MD [facility] Pager: [email or web address] Password [email or web address] 02/17/2016, 1:00 PM

## 2016-02-17 NOTE — Clinical Social Work Note (Signed)
Clinical Social Work Assessment  Patient Details  Name: Carl Mcdonald R Razavi MRN: [id number] Date of Birth: [birth year]  Date of referral:  02/17/16               Reason for consult:  Facility Placement                Permission sought to share information with:  Oceanographer[facility]ility Contact Representative Permission granted to share information::  Yes, Verbal Permission Granted  Name::     Hospital doctorichard  Agency::  SNF  Relationship::  brother-in-law  Contact Information:     Housing/Transportation Living arrangements for the past 2 months:  Single Family Home Source of Information:  Other (Comment Required) (caregiver/brother in Social workerlaw) Patient Interpreter Needed:  None Criminal Activity/Legal Involvement Pertinent to Current Situation/Hospitalization:  No - Comment as needed Significant Relationships:  Siblings Lives with:  Siblings Do you feel safe going back to the place where you live?  No Need for family participation in patient care:  Yes (Comment) (caregiving and decision making)  Care giving concerns:  Pt lives at home with sister and brother in law- brother in law is primary caregiver but states pt is too weak at this time for him to be safe at home   Office managerocial Worker assessment / plan:  CSW spoke with pt and pt brother concerning plan for time of DC.  Pt alert but not oriented completely- tried to engage pt with little success- he would attempt to answer questions but was difficult to understand.  Employment status:  Disabled (Comment on whether or not currently receiving Disability) Insurance information:  [place] PT Recommendations:  Skilled Nursing Facility Information / Referral to community resources:  Skilled Nursing Facility  Patient/Family's Response to care:  Patient brother in law agreeable to rehab stay for patient and hopeful he can be somewhere closer to home ([place], [place]  Patient/Family's Understanding of and Emotional Response to Diagnosis, Current Treatment, and  Prognosis:  No questions or concerns at this time.  Emotional Assessment Appearance:  Appears stated age Attitude/Demeanor/Rapport:  Lethargic Affect (typically observed):  Withdrawn Orientation:  Oriented to Self Alcohol / Substance use:  Not Applicable Psych involvement (Current and /or in the community):  No (Comment)  Discharge Needs  Concerns to be addressed:  Care Coordination Readmission within the last 30 days:  No Current discharge risk:  Physical Impairment Barriers to Discharge:  Continued Medical Work up   Burna SisUris, Fernado Brigante H, LCSW 02/17/2016, 3:07 PM

## 2016-02-17 NOTE — NC FL2 (Signed)
[place] MEDICAID FL2 LEVEL OF CARE SCREENING TOOL     IDENTIFICATION  Patient Name: Carl Mcdonald Birthdate: Oct 14, 1960 Sex: male Admission Date (Current Location): 02/14/2016  [facility] and [place] Number:  [employer] and Address:  [facility], [address] [place], [place] [address]      Provider Number: [id number]  Attending Physician Name and Address:  Calvert Cantor, MD  Relative Name and Phone Number:       Current Level of Care: Hospital Recommended Level of Care: Skilled Nursing Facility Prior Approval Number:    Date Approved/Denied:   PASRR Number:    Discharge Plan: SNF    Current Diagnoses: Patient Active Problem List   Diagnosis Date Noted  . Pressure injury of skin 02/15/2016  . Acute encephalopathy 02/15/2016  . Urinary retention 02/15/2016  . Acute renal failure (ARF) (HCC) 02/14/2016  . Anemia 02/14/2016  . Thrombocytopenia (HCC) 02/14/2016    Orientation RESPIRATION BLADDER Height & Weight     Self  Normal Incontinent, External catheter Weight: 195 lb 14.4 oz (88.9 kg) Height:  6' (182.9 cm)  BEHAVIORAL SYMPTOMS/MOOD NEUROLOGICAL BOWEL NUTRITION STATUS      Continent Diet (see DC summary)  AMBULATORY STATUS COMMUNICATION OF NEEDS Skin   Extensive Assist Verbally Other (Comment) (DTI on hip- foam dressing)                       Personal Care Assistance Level of Assistance  Bathing, Dressing Bathing Assistance: Maximum assistance   Dressing Assistance: Maximum assistance     Functional Limitations Info             SPECIAL CARE FACTORS FREQUENCY  PT (By licensed PT), OT (By licensed OT)     PT Frequency: 5/wk OT Frequency: 5/wk            Contractures      Additional Factors Info  Code Status, Allergies, Psychotropic Code Status Info: FULL Allergies Info: Lithium Psychotropic Info: zyprexa         Current Medications (02/17/2016):  This is the current hospital active  medication list Current Facility-Administered Medications  Medication Dose Route Frequency Provider Last Rate Last Dose  . 0.9 %  sodium chloride infusion   Intravenous Continuous Calvert Cantor, MD 75 mL/hr at 02/17/16 0513    . 0.9 %  sodium chloride infusion   Intravenous Once Calvert Cantor, MD      . benztropine (COGENTIN) tablet 1 mg  1 mg Oral BID Calvert Cantor, MD   1 mg at 02/16/16 2200  . divalproex (DEPAKOTE ER) 24 hr tablet 750 mg  750 mg Oral Daily Calvert Cantor, MD   750 mg at 02/16/16 1051  . feeding supplement (BOOST / RESOURCE BREEZE) liquid 1 Container  1 Container Oral TID BM Calvert Cantor, MD      . feeding supplement (ENSURE ENLIVE) (ENSURE ENLIVE) liquid 237 mL  237 mL Oral TID BM Calvert Cantor, MD      . MEDLINE mouth rinse  15 mL Mouth Rinse BID Calvert Cantor, MD   15 mL at 02/16/16 2200  . megestrol (MEGACE) 400 MG/10ML suspension 800 mg  800 mg Oral BID Calvert Cantor, MD      . metoprolol succinate (TOPROL-XL) 24 hr tablet 25 mg  25 mg Oral Daily Calvert Cantor, MD   25 mg at 02/16/16 1051  . OLANZapine (ZYPREXA) tablet 15 mg  15 mg Oral QHS Calvert Cantor, MD  15 mg at 02/16/16 2159  . pantoprazole (PROTONIX) EC tablet 40 mg  40 mg Oral Daily Calvert CantorSaima Rizwan, MD   40 mg at 02/16/16 1051  . tamsulosin (FLOMAX) capsule 0.4 mg  0.4 mg Oral QPC supper Calvert CantorSaima Rizwan, MD   0.4 mg at 02/16/16 1824     Discharge Medications: Please see discharge summary for a list of discharge medications.  Relevant Imaging Results:  Relevant Lab Results:   Additional Information SS#: [id number]  Burna SisUris, Ifeoluwa Bartz H, LCSW

## 2016-02-17 NOTE — Evaluation (Signed)
Occupational Therapy Evaluation Patient Details Name: Carl Mcdonald Naff MRN: [id number] DOB: 1960/12/30 Today's Date: 02/17/2016    History of Present Illness Carl HowellsDouglas Mcdonald Needhamis a 56 y.o.malewho is brought in by EMS from home for confusion. According to history obtained by the EMS, the patient has been confused at home for past 2 days. He was noted to have a pulse ox of 88% when EMS arrived to pick him up. Encephalopathy due to AKI, and ?febrile illness (hypoxia and fever resolved), acute renal failure due to urinary retention, thrombocytopenia; pertinent history of schizophrenia, concern that psych meds may be contributing to thrombocytopenia.   Clinical Impression   Per pts brother in law, pt was independent with BADL PTA. Currently pt overall mod assist for stand pivot transfers, min assist for seated UB ADL, and mod assist for LB ADL. Pt requires max verbal cues for sequencing, initiation, and maintaining upright posture in sitting/standing. Pt presenting with cognitive deficits, deconditioning, generalized weakness, Mcdonald lateral and posterior lean in sitting and standing impacting his independence and safety with ADL and functional mobility. Pt very agreeable to participate in therapy and reports "I just want to get better"; supportive family present in room upon OT eval. Recommending CIR level therapies to maximize independence and safety with ADL and functional mobility prior to return home. Pt would benefit from continued skilled OT to address established goals.     Follow Up Recommendations  CIR;Supervision/Assistance - 24 hour    Equipment Recommendations  Other (comment) (TBD at next venue)    Recommendations for Other Services       Precautions / Restrictions Precautions Precautions: Fall Restrictions Weight Bearing Restrictions: No      Mobility Bed Mobility Overal bed mobility: Needs Assistance Bed Mobility: Supine to Sit     Supine to sit: Mod assist      General bed mobility comments: Assist for LEs to EOB and for trunk elevation from supine to sit. Cues for hand placement and technique. HOB slightly elevated with use of bed rails. Use of bed pad to scoot hips to EOB.  Transfers Overall transfer level: Needs assistance Equipment used: 1 person hand held assist Transfers: Sit to/from [employer] CorporationStand;Stand Pivot Transfers Sit to Stand: Min assist Stand pivot transfers: Mod assist       General transfer comment: Min assist to boost up from EOB with cues for hand placement. Mod assist for stand pivot with cues for sequencing. Pt with Mcdonald lateral lean in standing, able to correct with cues but difficulty maintatining.    Balance Overall balance assessment: Needs assistance Sitting-balance support: Feet supported;No upper extremity supported Sitting balance-Leahy Scale: Poor Sitting balance - Comments: Cues for sitting up straight, pt able to self correct but unable to maintain for more than a few seconds at a time. Min-min guard assist provided. Postural control: Right lateral lean;Posterior lean Standing balance support: Bilateral upper extremity supported Standing balance-Leahy Scale: Poor Standing balance comment: Bil UE support with mod assist in standing. Mcdonald lateral lean, cues to maintain upright posture. Pt able to self correct but unable to maintain.                            ADL Overall ADL's : Needs assistance/impaired     Grooming: Minimal assistance;Sitting;Wash/dry face Grooming Details (indicate cue type and reason): min assist for sitting balance, pt with Mcdonald lateral lean and posterior lean in sitting Upper Body Bathing: Minimal assistance;Sitting   Lower Body  Bathing: Moderate assistance;Sit to/from stand   Upper Body Dressing : Minimal assistance;Sitting Upper Body Dressing Details (indicate cue type and reason): to doff/don hospital gown Lower Body Dressing: Moderate assistance;Sit to/from stand Lower Body Dressing  Details (indicate cue type and reason): Pt able to adjust socks sitting in chair with min guard assist. Anticipate he would require assist with pulling up clothing in standing Toilet Transfer: Moderate assistance;Stand-pivot;BSC Toilet Transfer Details (indicate cue type and reason): Simulated by transfer from EOB to chair Toileting- Clothing Manipulation and Hygiene: Total assistance;Sit to/from stand Toileting - Architect Details (indicate cue type and reason): RN tech assisting with peri care     Functional mobility during ADLs: Moderate assistance (for stand pivot only) General ADL Comments: Pt found incontinent of bowel; RN tech assisting with cleaning. Pt requires max cues for sequencing, initiation, and posture in sitting/standing.      Vision Additional Comments: Needs further assessment. Seems to have Mcdonald side preference but does attend to visual stimulus on L and able to cross midline.   Perception     Praxis      Pertinent Vitals/Pain Pain Assessment: No/denies pain     Hand Dominance Right   Extremity/Trunk Assessment Upper Extremity Assessment Upper Extremity Assessment: Generalized weakness   Lower Extremity Assessment Lower Extremity Assessment: Defer to PT evaluation   Cervical / Trunk Assessment Cervical / Trunk Assessment: Normal (with cues, Mcdonald and posterior lean)   Communication Communication Communication: Expressive difficulties   Cognition Arousal/Alertness: Awake/alert Behavior During Therapy: Flat affect Overall Cognitive Status: Impaired/Different from baseline Area of Impairment: Orientation;Following commands;Problem solving Orientation Level: Disoriented to;Time;Situation     Following Commands: Follows one step commands consistently     Problem Solving: Slow processing;Decreased initiation;Difficulty sequencing;Requires verbal cues     General Comments       Exercises       Shoulder Instructions      Home Living  Family/patient expects to be discharged to:: Private residence Living Arrangements: Other relatives (sister and brother in law) Available Help at Discharge: Family;Available 24 hours/day Type of Home: House Home Access: Stairs to enter [employer] of Steps: 3-4   Home Layout: One level     Bathroom Shower/Tub: Tub/shower unit;Walk-in shower   Bathroom Toilet: Standard     Home Equipment: Crutches          Prior Functioning/Environment Level of Independence: Independent        Comments: independent with BADL, occasionally helped with yard work, sister does household chores, does not drive.        OT Problem List: Decreased strength;Decreased activity tolerance;Impaired balance (sitting and/or standing);Impaired vision/perception;Decreased cognition;Decreased coordination;Decreased safety awareness;Decreased knowledge of use of DME or AE   OT Treatment/Interventions: Self-care/ADL training;Energy conservation;DME and/or AE instruction;Therapeutic activities;Cognitive remediation/compensation;Balance training;Patient/family education    OT Goals(Current goals can be found in the care plan section) Acute Rehab OT Goals Patient Stated Goal: "I just want to get better" OT Goal Formulation: With patient/family Time For Goal Achievement: 03/02/16 Potential to Achieve Goals: Good ADL Goals Pt Will Perform Grooming: with supervision;standing Pt Will Perform Upper Body Bathing: with supervision;sitting Pt Will Perform Lower Body Bathing: with supervision;sit to/from stand Pt Will Transfer to Toilet: with supervision;ambulating;regular height toilet Pt Will Perform Toileting - Clothing Manipulation and hygiene: with supervision;sit to/from stand  OT Frequency: Min 2X/week   Barriers to D/C:            Co-evaluation  End of Session Equipment Utilized During Treatment: Gait belt Nurse Communication: Mobility status;Other (comment) (pt found  incontinent of bowel)  Activity Tolerance: Patient tolerated treatment well Patient left: in chair;with call bell/phone within reach;with chair alarm set;with family/visitor present   Time: 1610-9604 OT Time Calculation (min): 23 min Charges:  OT General Charges $OT Visit: 1 Procedure OT Evaluation $OT Eval Moderate Complexity: 1 Procedure OT Treatments $Self Care/Home Management : 8-22 mins G-Codes:     Gaye Alken M.S., OTR/L Pager: [phone]  02/17/2016, 12:00 PM

## 2016-02-18 DIAGNOSIS — Z87891 Personal history of nicotine dependence: Secondary | ICD-10-CM

## 2016-02-18 DIAGNOSIS — Z79899 Other long term (current) drug therapy: Secondary | ICD-10-CM

## 2016-02-18 DIAGNOSIS — Z888 Allergy status to other drugs, medicaments and biological substances status: Secondary | ICD-10-CM

## 2016-02-18 DIAGNOSIS — F209 Schizophrenia, unspecified: Secondary | ICD-10-CM

## 2016-02-18 DIAGNOSIS — N179 Acute kidney failure, unspecified: Principal | ICD-10-CM

## 2016-02-18 LAB — TYPE AND SCREEN
ABO/RH(D): O POS
ANTIBODY SCREEN: NEGATIVE
UNIT DIVISION: 0

## 2016-02-18 LAB — BASIC METABOLIC PANEL
ANION GAP: 10 (ref 5–15)
BUN: 12 mg/dL (ref 6–20)
CO2: 25 mmol/L (ref 22–32)
Calcium: 7.9 mg/dL — ABNORMAL LOW (ref 8.9–10.3)
Chloride: 103 mmol/L (ref 101–111)
Creatinine, Ser: 1.12 mg/dL (ref 0.61–1.24)
GFR calc Af Amer: >60 mL/min (ref >60)
GLUCOSE: 89 mg/dL (ref 65–99)
POTASSIUM: 3.6 mmol/L (ref 3.5–5.1)
Sodium: 138 mmol/L (ref 135–145)

## 2016-02-18 LAB — PROTIME-INR
INR: 1.46
PROTHROMBIN TIME: 17.8 s — AB (ref 11.4–15.2)

## 2016-02-18 LAB — CBC
HEMATOCRIT: 24.2 % — AB (ref 39.0–52.0)
Hemoglobin: 8 g/dL — ABNORMAL LOW (ref 13.0–17.0)
MCH: 32.8 pg (ref 26.0–34.0)
MCHC: 33.1 g/dL (ref 30.0–36.0)
MCV: 99.2 fL (ref 78.0–100.0)
Platelets: 63 10*3/uL — ABNORMAL LOW (ref 150–400)
RBC: 2.44 MIL/uL — AB (ref 4.22–5.81)
RDW: 19 % — ABNORMAL HIGH (ref 11.5–15.5)
WBC: 5.8 10*3/uL (ref 4.0–10.5)

## 2016-02-18 MED ORDER — ENSURE ENLIVE PO LIQD
237.0000 mL | Freq: Four times a day (QID) | ORAL | Status: DC
  Administered 2016-02-18 (×3): 237 mL via ORAL

## 2016-02-18 NOTE — Progress Notes (Signed)
Noted pt.HNV since 1800 .Bladder scan done &  Noted 509 cc.Texted  MD on call Craige CottaKirby & made aware & ordered I &O cath.

## 2016-02-18 NOTE — Progress Notes (Signed)
CSW provided offers to Gerlene BurdockRichard- [facility] but they would have to pay PTAR upfront for transport since over 50 miles  [facility] [place] which will be somewhat closer for caregiver  CSW will continue to follow  Burna SisJenna H. Altariq Goodall, LCSW Clinical Social Worker [phone]

## 2016-02-18 NOTE — Progress Notes (Signed)
Rehab admissions - I met with patient and his brother in law.  Brother in law is driving 2 hours to [facility] to see patient.  Best option would be rehab closer to [place] or even in [place] to decrease driving time for family.  Social worker and case manager aware of family preference for rehab closer to home.  #[phone]

## 2016-02-18 NOTE — Progress Notes (Signed)
I &O cath.done & obtained 600 cc urine output.

## 2016-02-18 NOTE — Consult Note (Signed)
[facility] Psychiatry Consult   Reason for Consult:  Schizoaffective disorder Referring Physician:  Dr. Wynelle Cleveland Patient Identification: Carl Mcdonald MRN:  [id number] Principal Diagnosis: Acute encephalopathy Diagnosis:   Patient Active Problem List   Diagnosis Date Noted  . AKI (acute kidney injury) ([place]) [N17.9]   . Schizophrenia ([place]) [F20.9]   . Acute blood loss anemia [D62]   . Pressure injury of skin [L89.90] 02/15/2016  . Acute encephalopathy [G93.40] 02/15/2016  . Urinary retention [R33.9] 02/15/2016  . Acute renal failure (ARF) ([place]) [N17.9] 02/14/2016  . Anemia [D64.9] 02/14/2016  . Thrombocytopenia ([place]) [D69.6] 02/14/2016    Total Time spent with patient: 1 hour  Subjective:   Carl Mcdonald is a 56 y.o. male patient admitted with Confusion.  HPI:  Carl Mcdonald is a 56 y.o. male, seen, , chart reviewed for this face-to-face psychiatric consultation and evaluation of history of schizophrenia and poor oral intake. Patient appeared awake, alert oriented to his name and being in hospital only. Patient reported he does not remember why he was in the hospital. Patient reported he was from [facility] and seeing a psychiatric provider and receiving medication management for schizophrenia. Patient reported he has no current symptoms of auditory/visual hallucinations, delusions or paranoia. Patient is also able to contract for safety and states he does not have any suicidal or homicidal ideation, intention or plans. Reportedly he was received electroconvulsive therapies in the past which resulted in significant cognitive deficits especially delayed and long-term memory. Patient stated he is able to drink liquids without having any difficulties. Patient has no extrapyramidal symptoms.  Past Psychiatric History: Schizoaffective disorder and last admission to [facility] is 2013. He has history of ECT treatments.  Risk to Self: Is patient at risk for suicide?: No Risk to Others:    Prior Inpatient Therapy:   Prior Outpatient Therapy:    Past Medical History: History reviewed. No pertinent past medical history. History reviewed. No pertinent surgical history. Family History: History reviewed. No pertinent family history. Family Psychiatric  History: patient denies family history of mental illness. Social History:  History  Alcohol Use No     History  Drug Use No    Social History   Social History  . Marital status: Unknown    Spouse name: N/A  . Number of children: N/A  . Years of education: N/A   Social History Main Topics  . Smoking status: Former Research scientist (life sciences)  . Smokeless tobacco: Never Used  . Alcohol use No  . Drug use: No  . Sexual activity: Not Currently   Other Topics Concern  . None   Social History Narrative  . None   Additional Social History:    Allergies:   Allergies  Allergen Reactions  . Lithium Other (See Comments)    Agitation/ hallucinations    Labs:  Results for orders placed or performed during the hospital encounter of 02/14/16 (from the past 48 hour(s))  Basic metabolic panel     Status: Abnormal   Collection Time: 02/17/16  4:16 AM  Result Value Ref Range   Sodium 142 135 - 145 mmol/L   Potassium 3.6 3.5 - 5.1 mmol/L   Chloride 112 (H) 101 - 111 mmol/L   CO2 25 22 - 32 mmol/L   Glucose, Bld 82 65 - 99 mg/dL   BUN 14 6 - 20 mg/dL   Creatinine, Ser 1.24 0.61 - 1.24 mg/dL   Calcium 8.1 (L) 8.9 - 10.3 mg/dL   GFR calc non Af  Amer >60 >60 mL/min   GFR calc Af Amer >60 >60 mL/min    Comment: (NOTE) The eGFR has been calculated using the CKD EPI equation. This calculation has not been validated in all clinical situations. eGFR's persistently <60 mL/min signify possible Chronic Kidney Disease.    Anion gap 5 5 - 15  CBC     Status: Abnormal   Collection Time: 02/17/16  4:16 AM  Result Value Ref Range   WBC 5.0 4.0 - 10.5 K/uL   RBC 2.12 (L) 4.22 - 5.81 MIL/uL   Hemoglobin 7.2 (L) 13.0 - 17.0 g/dL   HCT 22.0 (L)  39.0 - 52.0 %   MCV 103.8 (H) 78.0 - 100.0 fL   MCH 34.0 26.0 - 34.0 pg   MCHC 32.7 30.0 - 36.0 g/dL   RDW 17.3 (H) 11.5 - 15.5 %   Platelets 44 (L) 150 - 400 K/uL    Comment: CONSISTENT WITH PREVIOUS RESULT  Type and screen [place]     Status: None   Collection Time: 02/17/16  7:58 AM  Result Value Ref Range   ABO/RH(D) O POS    Antibody Screen NEG    Sample Expiration 02/20/2016    Unit Number [id number]    Blood Component Type RED CELLS,LR    Unit division 00    Status of Unit ISSUED,FINAL    Transfusion Status OK TO TRANSFUSE    Crossmatch Result Compatible   ABO/Rh     Status: None   Collection Time: 02/17/16  7:58 AM  Result Value Ref Range   ABO/RH(D) O POS   Hemoglobin and Hematocrit     Status: Abnormal   Collection Time: 02/17/16  7:58 AM  Result Value Ref Range   Hemoglobin 7.3 (L) 13.0 - 17.0 g/dL   HCT 22.3 (L) 39.0 - 52.0 %  Prepare RBC     Status: None   Collection Time: 02/17/16  7:58 AM  Result Value Ref Range   Order Confirmation ORDER PROCESSED BY BLOOD BANK   Hemoglobin and hematocrit, blood     Status: Abnormal   Collection Time: 02/17/16  3:34 PM  Result Value Ref Range   Hemoglobin 9.9 (L) 13.0 - 17.0 g/dL    Comment: REPEATED TO VERIFY POST TRANSFUSION SPECIMEN    HCT 29.1 (L) 39.0 - 52.0 %  CBC     Status: Abnormal   Collection Time: 02/18/16  4:31 AM  Result Value Ref Range   WBC 5.8 4.0 - 10.5 K/uL   RBC 2.44 (L) 4.22 - 5.81 MIL/uL   Hemoglobin 8.0 (L) 13.0 - 17.0 g/dL   HCT 24.2 (L) 39.0 - 52.0 %   MCV 99.2 78.0 - 100.0 fL   MCH 32.8 26.0 - 34.0 pg   MCHC 33.1 30.0 - 36.0 g/dL   RDW 19.0 (H) 11.5 - 15.5 %   Platelets 63 (L) 150 - 400 K/uL    Comment: CONSISTENT WITH PREVIOUS RESULT  Basic metabolic panel     Status: Abnormal   Collection Time: 02/18/16  4:31 AM  Result Value Ref Range   Sodium 138 135 - 145 mmol/L   Potassium 3.6 3.5 - 5.1 mmol/L   Chloride 103 101 - 111 mmol/L   CO2 25 22 - 32 mmol/L    Glucose, Bld 89 65 - 99 mg/dL   BUN 12 6 - 20 mg/dL   Creatinine, Ser 1.12 0.61 - 1.24 mg/dL   Calcium 7.9 (L) 8.9 - 10.3 mg/dL  GFR calc non Af Amer >60 >60 mL/min   GFR calc Af Amer >60 >60 mL/min    Comment: (NOTE) The eGFR has been calculated using the CKD EPI equation. This calculation has not been validated in all clinical situations. eGFR's persistently <60 mL/min signify possible Chronic Kidney Disease.    Anion gap 10 5 - 15  Protime-INR     Status: Abnormal   Collection Time: 02/18/16  8:06 AM  Result Value Ref Range   Prothrombin Time 17.8 (H) 11.4 - 15.2 seconds   INR 1.46     Current Facility-Administered Medications  Medication Dose Route Frequency Provider Last Rate Last Dose  . 0.9 %  sodium chloride infusion   Intravenous Continuous Debbe Odea, MD 75 mL/hr at 02/18/16 0929    . benztropine (COGENTIN) tablet 1 mg  1 mg Oral BID Debbe Odea, MD   1 mg at 02/18/16 0918  . divalproex (DEPAKOTE ER) 24 hr tablet 750 mg  750 mg Oral Daily Debbe Odea, MD   750 mg at 02/18/16 0919  . feeding supplement (ENSURE ENLIVE) (ENSURE ENLIVE) liquid 237 mL  237 mL Oral QID Debbe Odea, MD   237 mL at 02/18/16 1233  . MEDLINE mouth rinse  15 mL Mouth Rinse BID Debbe Odea, MD   15 mL at 02/18/16 0920  . megestrol (MEGACE) 400 MG/10ML suspension 400 mg  400 mg Oral BID Debbe Odea, MD   400 mg at 02/18/16 0920  . metoprolol succinate (TOPROL-XL) 24 hr tablet 25 mg  25 mg Oral Daily Debbe Odea, MD   25 mg at 02/18/16 0919  . multivitamins with iron tablet 1 tablet  1 tablet Oral Daily Debbe Odea, MD   1 tablet at 02/18/16 0920  . OLANZapine (ZYPREXA) tablet 15 mg  15 mg Oral QHS Debbe Odea, MD   15 mg at 02/17/16 2218  . pantoprazole (PROTONIX) EC tablet 40 mg  40 mg Oral Daily Debbe Odea, MD   40 mg at 02/18/16 0919  . tamsulosin (FLOMAX) capsule 0.4 mg  0.4 mg Oral QPC supper Debbe Odea, MD   0.4 mg at 02/17/16 1737    Musculoskeletal: Strength & Muscle Tone:  within normal limits Gait & Station: normal Patient leans: N/A  Psychiatric Specialty Exam: Physical Exam as per history and physical   ROS difficult to swallow the solid foods and significant cognitive deficits especially memory and recent events. No Fever-chills, No Headache, No changes with Vision or hearing, reports vertigo No problems swallowing food or Liquids, No Chest pain, Cough or Shortness of Breath, No Abdominal pain, No Nausea or Vommitting, Bowel movements are regular, No Blood in stool or Urine, No dysuria, No new skin rashes or bruises, No new joints pains-aches,  No new weakness, tingling, numbness in any extremity, No recent weight gain or loss, No polyuria, polydypsia or polyphagia,  A full 10 point Review of Systems was done, except as stated above, all other Review of Systems were negative.   Blood pressure 107/62, pulse 74, temperature 99.1 F (37.3 C), temperature source Oral, resp. rate 18, height 6' (1.829 m), weight 88.9 kg (195 lb 14.4 oz), SpO2 99 %.Body mass index is 26.57 kg/m.  General Appearance: Disheveled  Eye Contact:  Good  Speech:  Clear and Coherent  Volume:  Normal  Mood:  Euthymic  Affect:  Appropriate and Congruent  Thought Process:  Coherent  Orientation:  Full (Time, Place, and Person)  Thought Content:  Logical  Suicidal Thoughts:  No  Homicidal Thoughts:  No  Memory:  Immediate;   Fair Recent;   Poor Remote;   Poor  Judgement:  Fair  Insight:  Fair  Psychomotor Activity:  Normal  Concentration:  Concentration: Poor and Attention Span: Poor  Recall:  Poor  Fund of Knowledge:  Fair  Language:  Good  Akathisia:  Negative  Handed:  Right  AIMS (if indicated):     Assets:  Agricultural consultant Housing Leisure Time Social Support  ADL's:  Intact  Cognition:  Impaired,  Moderate  Sleep:        Treatment Plan Summary: 56 years old male with a history of chronic schizoaffective disorder and  has been missing outpatient medication management from psychiatric provider in [place]. Patient reportedly received electroconvulsive therapies and has significant cognitive deficits. Patient has no extrapyramidal symptoms, no suicidal/homicidal ideations and no active psychotic symptoms during my evaluation.  Patient has no safety concerns Patient will continue his outpatient medication management and medically stable. Continue current medication Zyprexa and Depakote as prescribed and No psychotropic medication was started during my evaluation. Daily contact with patient to assess and evaluate symptoms and progress in treatment and Medication management  Appreciate psychiatric consultation and we sign off as of today Please contact [phone] or [phone] if needs further assistance   Disposition: Patient will be referred to the outpatient medication management No evidence of imminent risk to self or others at present.   Supportive therapy provided about ongoing stressors.  Ambrose Finland, MD 02/18/2016 1:00 PM

## 2016-02-18 NOTE — Progress Notes (Signed)
PROGRESS NOTE    Carl Mcdonald  ZOX:[id number] DOB: 11-17-1960 DOA: 02/14/2016  PCP: No PCP Per Patient   Brief Narrative:  Carl Mcdonald is a 56 y.o. male who is brought in by EMS from home for confusion. According to history obtained by the EMS, the patient has been confused at home for past 2 days. He was noted to have a pulse ox of 88% when EMS arrived to pick him up. The patient was too confused to give a history.  Per caretaker who came in the next day, the patient is usually confused at baseline and has schizophrenia. He has been confused for about 2-3 days and "sluggish" for about 1-2 days. He does smoke but does not drink alcohol.   Subjective: No complaints today.  His caretaker is concerned that he has been barely eating at home and is still not eating in the hospital .  Assessment & Plan:   Principal Problem:   Acute encephalopathy - possibly due to AKI and ? Underlying infection as he was mildly febrile on initial presentation - CT head and MRI normal - Obtained ammonia level- only 39 - Depakote level normal - mental status has returned to baseline per his caretaker- suspect azotemia may have caused the lethargy    Acute renal failure  - BUN 32 and Cr 4.01 today - due to urinary retention>> although patient was urinating, he was found to later have about 1 L Urine in his bladder- Foley placed- started Flomax- needed I and O cath again- awaiting bladder scan today -- prerenal >>obtained urine sodium- found to be low and consistent with dehydration - UA unrevealing -  renal ultrasound normal - Cr improved to normal now  Severe anorexia - started Megace - has not eaten solid food since admission- have started Ensure- goal today is to see if he can drink at least 4 ensures today- have asked RN to start calorie count- would not d/c him unless PO intake is determined- if PO intake is poor, he will be a high risk for recurrent admission from dehydration   Metabolic  alkalosis -pH mildly elevated at 7.44 on admission, serum bicarbonate is normal   Low Grade fever -? Underlying infectious process - lactic acid normal - UA negative-chest x-ray clear but hypoxic on admission-  flu PCR negative - repeat CXR after hydrating him still negative for infiltrates  - hypoxia and fever resolved on day 2 - cultures negative- stop antibiotics today and follow for recurrence of fevers     Anemia -  anemia panel consistent with AOCD - Hemoccult pending -As bilirubin was elevated, obtained LDH and haptoglobin levels - not hemolysing - Hb has dropped to 7.3 with hydration-  transfused 1 u PRBC 1/15    Thrombocytopenia  - Difficult to tell if this is acute or chronic - both Zyprexa and Depakote can cause this but not safe to hold in patient with schizophrenia - follow- If platelets continue to drop, will need to d/c current psych meds but will need psych input first - avoiding blood thinners  Hypotension - given 2 L IVF in ER with improvement in BP- likely related to dehydration  Anorexia - lack of oral fluid or food intake- start Megace- cont to hydrate with IVF until oral intake improves  Elevated bilirubin -AST normal, ALT is low -Alkaline phosphatase is normal -Following   Schizophrenia -  Well controlled on Depakote & Zyprexa - lives with brother in law who is his primary  caretaker    DVT prophylaxis: SCDs Code Status: Full code Family Communication: POA- brother in law/ caretaker Disposition Plan:  Home when stable Consultants:    Procedures:    Antimicrobials:  Anti-infectives    Start     Dose/Rate Route Frequency Ordered Stop   02/15/16 1500  vancomycin (VANCOCIN) IVPB 750 mg/150 ml premix  Status:  Discontinued     750 mg 150 mL/hr over 60 Minutes Intravenous Every 24 hours 02/14/16 1715 02/15/16 1019   02/15/16 1500  vancomycin (VANCOCIN) IVPB 1000 mg/200 mL premix  Status:  Discontinued     1,000 mg 200 mL/hr over 60 Minutes  Intravenous Every 24 hours 02/15/16 1019 02/16/16 0728   02/14/16 2300  piperacillin-tazobactam (ZOSYN) IVPB 3.375 g  Status:  Discontinued     3.375 g 12.5 mL/hr over 240 Minutes Intravenous Every 8 hours 02/14/16 1715 02/16/16 0728   02/14/16 1430  piperacillin-tazobactam (ZOSYN) IVPB 3.375 g     3.375 g 100 mL/hr over 30 Minutes Intravenous  Once 02/14/16 1428 02/14/16 1526   02/14/16 1430  vancomycin (VANCOCIN) IVPB 1000 mg/200 mL premix     1,000 mg 200 mL/hr over 60 Minutes Intravenous  Once 02/14/16 1428 02/14/16 1556       Objective: Vitals:   02/17/16 1245 02/17/16 1425 02/17/16 2105 02/18/16 0510  BP: 107/64 115/69 97/62 107/62  Pulse: 78 78 72 74  Resp: 16 18 18 18   Temp: 98.1 F (36.7 C) 98.2 F (36.8 C) 98.6 F (37 C) 99.1 F (37.3 C)  TempSrc: Oral Oral Oral Oral  SpO2: 97% 100% 96% 99%  Weight:      Height:        Intake/Output Summary (Last 24 hours) at 02/18/16 1217 Last data filed at 02/18/16 0929  Gross per 24 hour  Intake          2263.75 ml  Output              600 ml  Net          1663.75 ml   Filed Weights   02/14/16 2058 02/15/16 0900 02/15/16 1206  Weight: 87.7 kg (193 lb 5.5 oz) 90.1 kg (198 lb 10.2 oz) 88.9 kg (195 lb 14.4 oz)    Examination: General exam: Appears comfortable - confused to time and place HEENT: PERRLA,  no sclera icterus or thrush-  Respiratory system: Clear to auscultation. Respiratory effort normal. Cardiovascular system: S1 & S2 heard, RRR.  No murmurs  Gastrointestinal system: Abdomen soft, non-tender, nondistended. Normal bowel sound. No organomegaly Central nervous system: Alert and oriented. No focal neurological deficits. Extremities: No cyanosis, clubbing or edema Skin: No rashes or ulcers Psychiatry:  Mood & affect appropriate.     Data Reviewed: I have personally reviewed following labs and imaging studies  CBC:  Recent Labs Lab 02/14/16 1440 02/15/16 0312 02/16/16 0529 02/17/16 0416 02/17/16 0758  02/17/16 1534 02/18/16 0431  WBC 7.8 5.7 6.0 5.0  --   --  5.8  NEUTROABS 5.2  --   --   --   --   --   --   HGB 9.1* 9.2* 8.6* 7.2* 7.3* 9.9* 8.0*  HCT 27.7* 27.8* 26.0* 22.0* 22.3* 29.1* 24.2*  MCV 103.4* 102.2* 103.6* 103.8*  --   --  99.2  PLT 60* 49* 46* 44*  --   --  63*   Basic Metabolic Panel:  Recent Labs Lab 02/14/16 1440 02/14/16 2131 02/16/16 0529 02/17/16 0416 02/18/16 0431  NA  137 141 142 142 138  K 4.2 4.0 3.7 3.6 3.6  CL 103 108 109 112* 103  CO2 23 23 23 25 25   GLUCOSE 90 93 80 82 89  BUN 32* 30* 20 14 12   CREATININE 4.01* 3.31* 1.62* 1.24 1.12  CALCIUM 8.4* 8.2* 8.4* 8.1* 7.9*   GFR: Estimated Creatinine Clearance: 83.7 mL/min (by C-G formula based on SCr of 1.12 mg/dL). Liver Function Tests:  Recent Labs Lab 02/14/16 1440 02/14/16 2131  AST 36 63*  ALT 10* 10*  ALKPHOS 60 57  BILITOT 1.6* 2.0*  PROT 5.8* 6.0*  ALBUMIN 2.9* 2.8*   No results for input(s): LIPASE, AMYLASE in the last 168 hours.  Recent Labs Lab 02/14/16 1816  AMMONIA 39*   Coagulation Profile:  Recent Labs Lab 02/14/16 2131 02/18/16 0806  INR 1.50 1.46   Cardiac Enzymes: No results for input(s): CKTOTAL, CKMB, CKMBINDEX, TROPONINI in the last 168 hours. BNP (last 3 results) No results for input(s): PROBNP in the last 8760 hours. HbA1C: No results for input(s): HGBA1C in the last 72 hours. CBG: No results for input(s): GLUCAP in the last 168 hours. Lipid Profile: No results for input(s): CHOL, HDL, LDLCALC, TRIG, CHOLHDL, LDLDIRECT in the last 72 hours. Thyroid Function Tests: No results for input(s): TSH, T4TOTAL, FREET4, T3FREE, THYROIDAB in the last 72 hours. Anemia Panel: No results for input(s): VITAMINB12, FOLATE, FERRITIN, TIBC, IRON, RETICCTPCT in the last 72 hours. Urine analysis:    Component Value Date/Time   COLORURINE AMBER (A) 02/14/2016 1638   APPEARANCEUR CLEAR 02/14/2016 1638   APPEARANCEUR Clear 07/28/2011 1849   LABSPEC 1.009 02/14/2016  1638   LABSPEC 1.004 07/28/2011 1849   PHURINE 5.0 02/14/2016 1638   GLUCOSEU NEGATIVE 02/14/2016 1638   GLUCOSEU Negative 07/28/2011 1849   HGBUR NEGATIVE 02/14/2016 1638   BILIRUBINUR NEGATIVE 02/14/2016 1638   BILIRUBINUR Negative 07/28/2011 1849   KETONESUR NEGATIVE 02/14/2016 1638   PROTEINUR NEGATIVE 02/14/2016 1638   NITRITE NEGATIVE 02/14/2016 1638   LEUKOCYTESUR NEGATIVE 02/14/2016 1638   LEUKOCYTESUR Negative 07/28/2011 1849   Sepsis Labs: @LABRCNTIP (procalcitonin:4,lacticidven:4) ) Recent Results (from the past 240 hour(s))  Blood Culture (routine x 2)     Status: None (Preliminary result)   Collection Time: 02/14/16  2:40 PM  Result Value Ref Range Status   Specimen Description BLOOD LEFT HAND  Final   Special Requests BOTTLES DRAWN AEROBIC AND ANAEROBIC 5CC  Final   Culture NO GROWTH 3 DAYS  Final   Report Status PENDING  Incomplete  Blood Culture (routine x 2)     Status: None (Preliminary result)   Collection Time: 02/14/16  3:11 PM  Result Value Ref Range Status   Specimen Description BLOOD RIGHT HAND  Final   Special Requests BOTTLES DRAWN AEROBIC ONLY 10CC  Final   Culture NO GROWTH 3 DAYS  Final   Report Status PENDING  Incomplete  Urine culture     Status: None   Collection Time: 02/14/16  4:38 PM  Result Value Ref Range Status   Specimen Description URINE, CATHETERIZED  Final   Special Requests NONE  Final   Culture NO GROWTH  Final   Report Status 02/15/2016 FINAL  Final  MRSA PCR Screening     Status: None   Collection Time: 02/14/16 10:34 PM  Result Value Ref Range Status   MRSA by PCR NEGATIVE NEGATIVE Final    Comment:        The GeneXpert MRSA Assay (FDA approved for NASAL specimens  only), is one component of a comprehensive MRSA colonization surveillance program. It is not intended to diagnose MRSA infection nor to guide or monitor treatment for MRSA infections.          Radiology Studies: No results found.    Scheduled  Meds: . benztropine  1 mg Oral BID  . divalproex  750 mg Oral Daily  . feeding supplement (ENSURE ENLIVE)  237 mL Oral QID  . mouth rinse  15 mL Mouth Rinse BID  . megestrol  400 mg Oral BID  . metoprolol succinate  25 mg Oral Daily  . multivitamins with iron  1 tablet Oral Daily  . OLANZapine  15 mg Oral QHS  . pantoprazole  40 mg Oral Daily  . tamsulosin  0.4 mg Oral QPC supper   Continuous Infusions: . sodium chloride 75 mL/hr at 02/18/16 0929     LOS: 4 days    Time spent in minutes: 35    Kasaundra Fahrney, MD [facility] Pager: [email or web address] Password [facility] 02/18/2016, 12:17 PM

## 2016-02-18 NOTE — Progress Notes (Signed)
Calorie Count Note  48 hour calorie count ordered.  Diet: regular Supplements: Ensure Enlive po QID, each supplement provides 350 kcal and 20 grams of protein  Breakfast: 217 kcals, 7 grams protein Lunch: n/a Dinner: 251 kcals, 5 grams protein Supplements: 1 Ensure Enlive (350 kcals, 20 grams protein)  Nutrition Dx: Inadequate oral intake related to lethargy/confusion, poor appetite as evidenced by meal completion < 50%, per patient/family report; ongoing  Goal: Patient will meet greater than or equal to 90% of their needs; unmet  Intervention:   -Continue Ensure Enlive po QID, each supplement provides 350 kcal and 20 grams of protein -Continue MVI daily -Follow-up with calorie count results on 02/19/16  Carl Mcdonald, RD, LDN, CDE Pager: [phone] After hours Pager: [phone]

## 2016-02-18 NOTE — Progress Notes (Signed)
Physical Therapy Treatment Patient Details Name: Carl Mcdonald MRN: [id number] DOB: 03/25/1960 Today's Date: 02/18/2016    History of Present Illness Carl Mcdonald a 56 y.o.malewho is brought in by EMS from home for confusion. According to history obtained by the EMS, the patient has been confused at home for past 2 days. He was noted to have a pulse ox of 88% when EMS arrived to pick him up. Encephalopathy due to AKI, and ?febrile illness (hypoxia and fever resolved), acute renal failure due to urinary retention, thrombocytopenia; pertinent history of schizophrenia, concern that psych meds may be contributing to thrombocytopenia.    PT Comments    Pt performed increased mobility as evident by multiple short gait trials.  Pt will continue to require SNF placement to improve strength and functional mobility before returning home.  Will continue to progress patient during acute hospitalization.     Follow Up Recommendations  SNF     Equipment Recommendations  Other (comment) (TBD at next venue. )    Recommendations for Other Services       Precautions / Restrictions Precautions Precautions: Fall Restrictions Weight Bearing Restrictions: No    Mobility  Bed Mobility Overal bed mobility: Needs Assistance Bed Mobility: Supine to Sit     Supine to sit: Min assist     General bed mobility comments: + rail, increased time and assist for LEs to ground during trunk elevation.  Cues for forward weight shifting.    Transfers Overall transfer level: Needs assistance Equipment used: Rolling walker (2 wheeled) Transfers: Sit to/from Stand Sit to Stand: Min assist         General transfer comment: Cues for forward weight shifting, tactile cues for hand placement to and from seated surface.  Cues for hand placement on hand grips on RW.    Ambulation/Gait Ambulation/Gait assistance: Mod assist;+2 safety/equipment Ambulation Distance (Feet): 18 Feet (+ 15 ft + 12 ft.   ) Assistive device: Rolling walker (2 wheeled) Gait Pattern/deviations: Step-through pattern;Shuffle;Narrow base of support;Trunk flexed   Gait velocity interpretation: Below normal speed for age/gender General Gait Details: Cues to increase BOS, fatigues quickly, required chair for safety.  Pt buckling during stance phase.  Required cues to keep RW proximity close.     Stairs            Wheelchair Mobility    Modified Rankin (Stroke Patients Only)       Balance Overall balance assessment: Needs assistance   Sitting balance-Leahy Scale: Poor Sitting balance - Comments: Cues for sitting up straight, pt required min assist to correct but unable to maintain for more than a few seconds at a time. Cues for forward weight shifting.       Standing balance-Leahy Scale: Poor Standing balance comment: Bil UE support with mod assist in standing. R lateral lean, cues to maintain upright posture. Pt able to self correct but unable to maintain.                    Cognition Arousal/Alertness: Awake/alert Behavior During Therapy: WFL for tasks assessed/performed Overall Cognitive Status: Impaired/Different from baseline Area of Impairment: Attention;Problem solving;Orientation Orientation Level: Time;Situation     Following Commands: Follows one step commands with increased time     Problem Solving: Slow processing;Decreased initiation;Difficulty sequencing;Requires verbal cues      Exercises      General Comments        Pertinent Vitals/Pain Pain Assessment: 0-10 Pain Score: 8  Pain Location: back Pain  Intervention(s): Monitored during session;Repositioned    Home Living                      Prior Function            PT Goals (current goals can now be found in the care plan section) Acute Rehab PT Goals Patient Stated Goal: "I just want to get better" Potential to Achieve Goals: Good Progress towards PT goals: Progressing toward goals     Frequency    Min 3X/week      PT Plan Current plan remains appropriate    Co-evaluation             End of Session Equipment Utilized During Treatment: Gait belt Activity Tolerance: Patient tolerated treatment well Patient left: in chair;with call bell/phone within reach;with chair alarm set     Time: 1610-9604 PT Time Calculation (min) (ACUTE ONLY): 29 min  Charges:  $Gait Training: 8-22 mins $Therapeutic Activity: 8-22 mins                    G Codes:      Florestine Avers 2016-03-06, 2:29 PM  Joycelyn Rua, PTA pager [phone]

## 2016-02-19 DIAGNOSIS — G9340 Encephalopathy, unspecified: Secondary | ICD-10-CM

## 2016-02-19 DIAGNOSIS — D638 Anemia in other chronic diseases classified elsewhere: Secondary | ICD-10-CM

## 2016-02-19 DIAGNOSIS — R630 Anorexia: Secondary | ICD-10-CM

## 2016-02-19 LAB — BASIC METABOLIC PANEL
ANION GAP: 6 (ref 5–15)
BUN: 11 mg/dL (ref 6–20)
CALCIUM: 8.2 mg/dL — AB (ref 8.9–10.3)
CO2: 24 mmol/L (ref 22–32)
CREATININE: 1.26 mg/dL — AB (ref 0.61–1.24)
Chloride: 111 mmol/L (ref 101–111)
Glucose, Bld: 89 mg/dL (ref 65–99)
Potassium: 4.1 mmol/L (ref 3.5–5.1)
SODIUM: 141 mmol/L (ref 135–145)

## 2016-02-19 LAB — CULTURE, BLOOD (ROUTINE X 2)
Culture: NO GROWTH
Culture: NO GROWTH

## 2016-02-19 LAB — CBC
HEMATOCRIT: 24.8 % — AB (ref 39.0–52.0)
Hemoglobin: 8.2 g/dL — ABNORMAL LOW (ref 13.0–17.0)
MCH: 33.2 pg (ref 26.0–34.0)
MCHC: 33.1 g/dL (ref 30.0–36.0)
MCV: 100.4 fL — ABNORMAL HIGH (ref 78.0–100.0)
PLATELETS: 74 10*3/uL — AB (ref 150–400)
RBC: 2.47 MIL/uL — ABNORMAL LOW (ref 4.22–5.81)
RDW: 18.9 % — AB (ref 11.5–15.5)
WBC: 6.4 10*3/uL (ref 4.0–10.5)

## 2016-02-19 MED ORDER — ENSURE ENLIVE PO LIQD: 237.0000 mL | Freq: Four times a day (QID) | ORAL | 12 refills | Status: AC

## 2016-02-19 MED ORDER — TAMSULOSIN HCL 0.4 MG PO CAPS: 0.4000 mg | ORAL_CAPSULE | Freq: Every day | ORAL | 0 refills | Status: AC

## 2016-02-19 MED ORDER — MEGESTROL ACETATE 400 MG/10ML PO SUSP: 400.0000 mg | Freq: Two times a day (BID) | ORAL | 0 refills | Status: AC

## 2016-02-19 MED ORDER — TAB-A-VITE/IRON PO TABS: 1.0000 | ORAL_TABLET | Freq: Every day | ORAL | 0 refills | Status: AC

## 2016-02-19 NOTE — Progress Notes (Signed)
[facility] Anticipated discharge date: 1/17 Family notified: [name] [employer] by Sharin MonsPTAR- called at 11:10am  CSW signing off.  Burna SisJenna H. Indiana Pechacek, LCSW Clinical Social Worker [phone]

## 2016-02-19 NOTE — Discharge Summary (Addendum)
Physician Discharge Summary  Carl Mcdonald EXB:[id number] DOB: 10/11/1960 DOA: 02/14/2016  PCP: No PCP Per Patient  Admit date: 02/14/2016 Discharge date: 02/19/2016  Admitted From: home Disposition:  SNF   Recommendations for Outpatient Follow-up:  1. High risk for re-admission due to lack of appetite- encourage oral intake, 4 cans of Ensure a day- Recommend to continue a calorie count over the next 4-5 days at SNF -make sure he is receiving > 1200 calories/ day and at least 1 L of fluid.  2. Follow platelets closely  Discharge Condition:  stable   CODE STATUS:  Full code   Diet recommendation:  Regular diet Consultations:      Discharge Diagnoses:  Principal Problem:   AKI (acute kidney injury) (HCC) Active Problems:   Thrombocytopenia (HCC)   Urinary retention   Acute encephalopathy   Anemia of chronic disease   Pressure injury of skin   Schizophrenia (HCC)    Subjective: No complaints- pleasantly confused and at baseline.  Brief Summary: Carl Mcdonald a 56 y.o.malewho is brought in by EMS from home for confusion. According to history obtained by the EMS, the patient has been confused at home for past 2 days. He was noted to have a pulse ox of 88% when EMS arrived to pick him up. The patient was too confused to give a history.  Per caretaker who came in the next day, the patient is usually confused at baseline and has schizophrenia. He has been confused for about 2-3 days and "sluggish" for about 1-2 days. He does smoke but does not drink alcohol.   Hospital Course:  Principal Problem:   Acute encephalopathy - possibly due to AKI and ? Underlying infection as he was mildly febrile on initial presentation - CT head and MRI normal - Obtained ammonia level- only 39 - Depakote level normal - mental status has returned to baseline per his caretaker- suspect azotemia may have caused the lethargy  Acute renal failure  - BUN 32 and Cr 4.01 today - due to  urinary retention>> although patient was urinating, he was found to later have about 1 L Urine in his bladder- Foley place & started on Flomax- failed voiding trial on 1/15-16 -  will need to f/u with Urology in 1 wk for further management of BPH -- prerenal >>  obtained urine sodium- found to be low and consistent with dehydration due to poor oral intake - UA unrevealing -  renal ultrasound normal - Cr improved to normal now  Severe anorexia - started Megace - has not eaten solid food since admission- have started Ensure - goal it to encourage him to  drink at least 4 ensures today-  In the pat 24 hrs he has drank/ eaten about 1200 calories- will need to continue to follow calorie and fluid intake closely at SNF   Metabolic alkalosis -pHmildly elevated at 7.44 on admission, serum bicarbonate is normal   Low Grade fever -?Underlying infectious process -lactic acid normal - UA negative-chest x-ray clear but hypoxic on admission-  flu PCR negative - repeat CXR after hydrating him still negative for infiltrates  - hypoxia and fever resolved on day 2 - cultures negative- stop antibiotics today and follow for recurrence of fevers   Anemia -  anemia panel consistent with AOCD - Hemoccult pending -As bilirubin was elevated, obtained LDH and haptoglobin levels - not hemolysing - Hb has dropped to 7.3 with hydration-  transfused 1 u PRBC 1/15  Thrombocytopenia  - Difficult  to tell if this is acute or chronic - both Zyprexa and Depakote can cause this but not safe to hold in patient with schizophrenia - follow- If platelets continue to drop, will need to d/c current psych meds but will need psych input first - avoiding blood thinners  Hypotension - given 2 L IVF in ER with improvement in BP- likely related to dehydration  Elevated bilirubin -AST normal, ALT is low -Alkaline phosphatase is normal -Following   Schizophrenia -  Well controlled on Depakote & Zyprexa - lives  with brother in law who is his primary caretaker    Discharge Instructions  Discharge Instructions    Diet general    Complete by:  As directed    Increase activity slowly    Complete by:  As directed      Allergies as of 02/19/2016      Reactions   Lithium Other (See Comments)   Agitation/ hallucinations      Medication List    STOP taking these medications   meloxicam 15 MG tablet Commonly known as:  MOBIC   tiZANidine 4 MG tablet Commonly known as:  ZANAFLEX     TAKE these medications   albuterol 108 (90 Base) MCG/ACT inhaler Commonly known as:  PROVENTIL HFA;VENTOLIN HFA Inhale 2 puffs into the lungs every 6 (six) hours as needed for wheezing or shortness of breath.   aspirin 325 MG tablet Take 325 mg by mouth daily as needed (pain).   benztropine 1 MG tablet Commonly known as:  COGENTIN Take 1 mg by mouth See admin instructions. Take 1 tablet (1 mg) by mouth every morning and at 3pm   divalproex 500 MG 24 hr tablet Commonly known as:  DEPAKOTE ER Take 500 mg by mouth See admin instructions. Take 1 tablet (500 mg) by mouth daily at bedtime - with a 250 mg tablet for a 750 mg dose   divalproex 250 MG 24 hr tablet Commonly known as:  DEPAKOTE ER Take 250 mg by mouth See admin instructions. Take 1 tablet (250 mg) by mouth daily at bedtime - with a 500 mg tablet for a 750 mg dose   feeding supplement (ENSURE ENLIVE) Liqd Take 237 mLs by mouth 4 (four) times daily.   fenofibrate 145 MG tablet Commonly known as:  TRICOR Take 145 mg by mouth daily.   Fluticasone-Salmeterol 250-50 MCG/DOSE Aepb Commonly known as:  ADVAIR Inhale 1 puff into the lungs 2 (two) times daily.   megestrol 400 MG/10ML suspension Commonly known as:  MEGACE Take 10 mLs (400 mg total) by mouth 2 (two) times daily.   metoprolol succinate 25 MG 24 hr tablet Commonly known as:  TOPROL-XL Take 25 mg by mouth daily at 3 pm.   multivitamins with iron Tabs tablet Take 1 tablet by mouth  daily.   OLANZapine 15 MG tablet Commonly known as:  ZYPREXA Take 30 mg by mouth at bedtime.   omeprazole 40 MG capsule Commonly known as:  PRILOSEC Take 40 mg by mouth daily.   simvastatin 40 MG tablet Commonly known as:  ZOCOR Take 40 mg by mouth at bedtime.   tamsulosin 0.4 MG Caps capsule Commonly known as:  FLOMAX Take 1 capsule (0.4 mg total) by mouth daily after supper.       Allergies  Allergen Reactions  . Lithium Other (See Comments)    Agitation/ hallucinations     Procedures/Studies:    Ct Head Wo Contrast  Result Date: 02/14/2016 CLINICAL DATA:  Dizziness.  Altered mental status. EXAM: CT HEAD WITHOUT CONTRAST TECHNIQUE: Contiguous axial images were obtained from the base of the skull through the vertex without intravenous contrast. COMPARISON:  CT scan of July 24, 2011. FINDINGS: Brain: No evidence of acute infarction, hemorrhage, hydrocephalus, extra-axial collection or mass lesion/mass effect. Vascular: No hyperdense vessel or unexpected calcification. Skull: Normal. Negative for fracture or focal lesion. Sinuses/Orbits: No acute finding. Other: None. IMPRESSION: Normal head CT. Electronically Signed   By: Lupita Raider, M.D.   On: 02/14/2016 17:16   Mr Brain Wo Contrast  Result Date: 02/15/2016 CLINICAL DATA:  Confusion.  Symptoms for 2 days. EXAM: MRI HEAD WITHOUT CONTRAST TECHNIQUE: Multiplanar, multiecho pulse sequences of the brain and surrounding structures were obtained without intravenous contrast. COMPARISON:  CT head 02/14/2016. FINDINGS: The patient was unable to remain motionless for the exam. Small or subtle lesions could be overlooked. Brain: No evidence for acute infarction, hemorrhage, mass lesion, hydrocephalus, or extra-axial fluid. Premature for age atrophy. Minor subcortical and periventricular T2 and FLAIR hyperintensities, likely chronic microvascular ischemic change. Vascular: Normal flow voids. Skull and upper cervical spine: Normal  marrow signal. Sinuses/Orbits: Negative. Other: None. IMPRESSION: Atrophy. No acute intracranial findings. Similar appearance to prior CT scan. Electronically Signed   By: Elsie Stain M.D.   On: 02/15/2016 14:19   US Renal  Result Date: 02/14/2016 CLINICAL DATA:  Acute kidney injury. EXAM: RENAL / URINARY TRACT ULTRASOUND COMPLETE COMPARISON:  CT scan 05/12/2007 FINDINGS: Right Kidney: Length: 10.5 cm. Echogenicity within normal limits. No mass or hydronephrosis visualized. Left Kidney: Length: 11.1 cm. Echogenicity within normal limits. No mass or hydronephrosis visualized. Bladder: Appears normal for degree of bladder distention. Prevoid residual calculated at 9:00 55 cc. Patient was unable to void so postvoid residual could not be determined. IMPRESSION: No hydronephrosis. Electronically Signed   By: Kennith Center M.D.   On: 02/14/2016 19:44   Dg Chest Port 1 View  Result Date: 02/15/2016 CLINICAL DATA:  Hypoxia EXAM: PORTABLE CHEST - 1 VIEW COMPARISON:  02/14/2016 FINDINGS: Low lung volumes. No focal infiltrate. Mild subsegmental atelectasis in the lung bases stable. Heart size and mediastinal contours are within normal limits. No effusion. Visualized bones unremarkable. IMPRESSION: 1. Low volumes.  No acute disease. Electronically Signed   By: Corlis Leak M.D.   On: 02/15/2016 09:03   Dg Chest Port 1 View  Result Date: 02/14/2016 CLINICAL DATA:  Altered mental status. EXAM: PORTABLE CHEST 1 VIEW COMPARISON:  Radiographs of November 23, 2014. FINDINGS: Stable cardiomediastinal silhouette. Hypoinflation of the lungs is noted with mild bibasilar subsegmental atelectasis. No pneumothorax or significant pleural effusion is noted. Bony thorax is intact. IMPRESSION: Hypoinflation of the lungs with mild bibasilar subsegmental atelectasis. Electronically Signed   By: Lupita Raider, M.D.   On: 02/14/2016 15:22       Discharge Exam: Vitals:   02/19/16 0509 02/19/16 1034  BP: (!) 100/58 136/76   Pulse: 71 75  Resp: 18   Temp: 98.3 F (36.8 C)    Vitals:   02/18/16 1521 02/18/16 2114 02/19/16 0509 02/19/16 1034  BP: 133/76 132/77 (!) 100/58 136/76  Pulse: 86 73 71 75  Resp: 20 18 18    Temp: 98.2 F (36.8 C) 97.4 F (36.3 C) 98.3 F (36.8 C)   TempSrc:      SpO2: 100% 96% 96%   Weight:   91.9 kg (202 lb 9.6 oz)   Height:        General: Pt is  alert, awake, not in acute distress Cardiovascular: RRR, S1/S2 +, no rubs, no gallops Respiratory: CTA bilaterally, no wheezing, no rhonchi Abdominal: Soft, NT, ND, bowel sounds + Extremities: no edema, no cyanosis    The results of significant diagnostics from this hospitalization (including imaging, microbiology, ancillary and laboratory) are listed below for reference.     Microbiology: Recent Results (from the past 240 hour(s))  Blood Culture (routine x 2)     Status: None (Preliminary result)   Collection Time: 02/14/16  2:40 PM  Result Value Ref Range Status   Specimen Description BLOOD LEFT HAND  Final   Special Requests BOTTLES DRAWN AEROBIC AND ANAEROBIC 5CC  Final   Culture NO GROWTH 4 DAYS  Final   Report Status PENDING  Incomplete  Blood Culture (routine x 2)     Status: None (Preliminary result)   Collection Time: 02/14/16  3:11 PM  Result Value Ref Range Status   Specimen Description BLOOD RIGHT HAND  Final   Special Requests BOTTLES DRAWN AEROBIC ONLY 10CC  Final   Culture NO GROWTH 4 DAYS  Final   Report Status PENDING  Incomplete  Urine culture     Status: None   Collection Time: 02/14/16  4:38 PM  Result Value Ref Range Status   Specimen Description URINE, CATHETERIZED  Final   Special Requests NONE  Final   Culture NO GROWTH  Final   Report Status 02/15/2016 FINAL  Final  MRSA PCR Screening     Status: None   Collection Time: 02/14/16 10:34 PM  Result Value Ref Range Status   MRSA by PCR NEGATIVE NEGATIVE Final    Comment:        The GeneXpert MRSA Assay (FDA approved for NASAL  specimens only), is one component of a comprehensive MRSA colonization surveillance program. It is not intended to diagnose MRSA infection nor to guide or monitor treatment for MRSA infections.      Labs: BNP (last 3 results) No results for input(s): BNP in the last 8760 hours. Basic Metabolic Panel:  Recent Labs Lab 02/14/16 2131 02/16/16 0529 02/17/16 0416 02/18/16 0431 02/19/16 0848  NA 141 142 142 138 141  K 4.0 3.7 3.6 3.6 4.1  CL 108 109 112* 103 111  CO2 23 23 25 25 24   GLUCOSE 93 80 82 89 89  BUN 30* 20 14 12 11   CREATININE 3.31* 1.62* 1.24 1.12 1.26*  CALCIUM 8.2* 8.4* 8.1* 7.9* 8.2*   Liver Function Tests:  Recent Labs Lab 02/14/16 1440 02/14/16 2131  AST 36 63*  ALT 10* 10*  ALKPHOS 60 57  BILITOT 1.6* 2.0*  PROT 5.8* 6.0*  ALBUMIN 2.9* 2.8*   No results for input(s): LIPASE, AMYLASE in the last 168 hours.  Recent Labs Lab 02/14/16 1816  AMMONIA 39*   CBC:  Recent Labs Lab 02/14/16 1440 02/15/16 0312 02/16/16 0529 02/17/16 0416 02/17/16 0758 02/17/16 1534 02/18/16 0431 02/19/16 0848  WBC 7.8 5.7 6.0 5.0  --   --  5.8 6.4  NEUTROABS 5.2  --   --   --   --   --   --   --   HGB 9.1* 9.2* 8.6* 7.2* 7.3* 9.9* 8.0* 8.2*  HCT 27.7* 27.8* 26.0* 22.0* 22.3* 29.1* 24.2* 24.8*  MCV 103.4* 102.2* 103.6* 103.8*  --   --  99.2 100.4*  PLT 60* 49* 46* 44*  --   --  63* 74*   Cardiac Enzymes: No results for input(s): CKTOTAL, CKMB,  CKMBINDEX, TROPONINI in the last 168 hours. BNP: Invalid input(s): POCBNP CBG: No results for input(s): GLUCAP in the last 168 hours. D-Dimer No results for input(s): DDIMER in the last 72 hours. Hgb A1c No results for input(s): HGBA1C in the last 72 hours. Lipid Profile No results for input(s): CHOL, HDL, LDLCALC, TRIG, CHOLHDL, LDLDIRECT in the last 72 hours. Thyroid function studies No results for input(s): TSH, T4TOTAL, T3FREE, THYROIDAB in the last 72 hours.  Invalid input(s): FREET3 Anemia work up No  results for input(s): VITAMINB12, FOLATE, FERRITIN, TIBC, IRON, RETICCTPCT in the last 72 hours. Urinalysis    Component Value Date/Time   COLORURINE AMBER (A) 02/14/2016 1638   APPEARANCEUR CLEAR 02/14/2016 1638   APPEARANCEUR Clear 07/28/2011 1849   LABSPEC 1.009 02/14/2016 1638   LABSPEC 1.004 07/28/2011 1849   PHURINE 5.0 02/14/2016 1638   GLUCOSEU NEGATIVE 02/14/2016 1638   GLUCOSEU Negative 07/28/2011 1849   HGBUR NEGATIVE 02/14/2016 1638   BILIRUBINUR NEGATIVE 02/14/2016 1638   BILIRUBINUR Negative 07/28/2011 1849   KETONESUR NEGATIVE 02/14/2016 1638   PROTEINUR NEGATIVE 02/14/2016 1638   NITRITE NEGATIVE 02/14/2016 1638   LEUKOCYTESUR NEGATIVE 02/14/2016 1638   LEUKOCYTESUR Negative 07/28/2011 1849   Sepsis Labs Invalid input(s): PROCALCITONIN,  WBC,  LACTICIDVEN Microbiology Recent Results (from the past 240 hour(s))  Blood Culture (routine x 2)     Status: None (Preliminary result)   Collection Time: 02/14/16  2:40 PM  Result Value Ref Range Status   Specimen Description BLOOD LEFT HAND  Final   Special Requests BOTTLES DRAWN AEROBIC AND ANAEROBIC 5CC  Final   Culture NO GROWTH 4 DAYS  Final   Report Status PENDING  Incomplete  Blood Culture (routine x 2)     Status: None (Preliminary result)   Collection Time: 02/14/16  3:11 PM  Result Value Ref Range Status   Specimen Description BLOOD RIGHT HAND  Final   Special Requests BOTTLES DRAWN AEROBIC ONLY 10CC  Final   Culture NO GROWTH 4 DAYS  Final   Report Status PENDING  Incomplete  Urine culture     Status: None   Collection Time: 02/14/16  4:38 PM  Result Value Ref Range Status   Specimen Description URINE, CATHETERIZED  Final   Special Requests NONE  Final   Culture NO GROWTH  Final   Report Status 02/15/2016 FINAL  Final  MRSA PCR Screening     Status: None   Collection Time: 02/14/16 10:34 PM  Result Value Ref Range Status   MRSA by PCR NEGATIVE NEGATIVE Final    Comment:        The GeneXpert MRSA  Assay (FDA approved for NASAL specimens only), is one component of a comprehensive MRSA colonization surveillance program. It is not intended to diagnose MRSA infection nor to guide or monitor treatment for MRSA infections.      Time coordinating discharge: Over 30 minutes  SIGNED:   Calvert Cantor, MD  [facility] 02/19/2016, 10:49 AM Pager   If 7PM-7AM, please contact night-coverage [email or web address] Password [email or web address]

## 2016-02-19 NOTE — Clinical Social Work Placement (Signed)
   CLINICAL SOCIAL WORK PLACEMENT  NOTE  Date:  02/19/2016  Patient Details  Name: Carl Mcdonald MRN: [id number] Date of Birth: 10-06-1960  Clinical Social Work is seeking post-discharge placement for this patient at the Skilled  Nursing Facility level of care (*CSW will initial, date and re-position this form in  chart as items are completed):  Yes   Patient/family provided with [facility] Social Work Department's list of facilities offering this level of care within the geographic area requested by the patient (or if unable, by the patient's family).  Yes   Patient/family informed of their freedom to choose among providers that offer the needed level of care, that participate in Medicare, Medicaid or managed care program needed by the patient, have an available bed and are willing to accept the patient.  Yes   Patient/family informed of [facility]'s ownership interest in [facility] and [facility], as well as of the fact that they are under no obligation to receive care at these facilities.  PASRR submitted to EDS on 02/17/16     PASRR number received on       Existing PASRR number confirmed on       FL2 transmitted to all facilities in geographic area requested by pt/family on 02/17/16     FL2 transmitted to all facilities within larger geographic area on       Patient informed that his/her managed care company has contracts with or will negotiate with certain facilities, including the following:        Yes   Patient/family informed of bed offers received.  [facility]     Physician recommends and patient chooses bed at      [facility].  Patient to be transferred to facility by ptar     Patient family notified on 02/19/16 of transfer.  Name of family member notified:  richard     PHYSICIAN Please sign FL2     Additional Comment:     _______________________________________________ Burna SisUris, Shanica Castellanos H, LCSW 02/19/2016, 11:11 AM

## 2018-11-14 IMAGING — MR MR HEAD W/O CM
9 of 11 series · 35 of 48 positions shown · non-contrast
Comparison: CT head 02/14/2016.

CLINICAL DATA: Confusion.  Symptoms for 2 days.

EXAM:
MRI HEAD WITHOUT CONTRAST
TECHNIQUE: Multiplanar, multiecho pulse sequences of the brain and surrounding
structures were obtained without intravenous contrast.

[Series 3: DWI · axial · 3.0mm · 0.94mm/px · z∈[-81,+66]mm · 8 of 100 slices shown (1 of 2)]
[im 1/100]
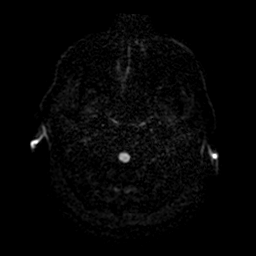
[im 15/100]
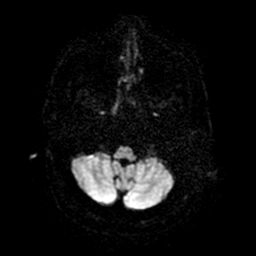
[im 29/100]
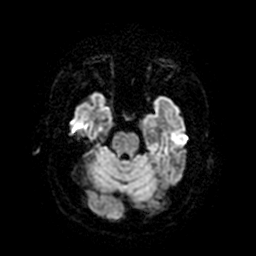
[im 43/100]
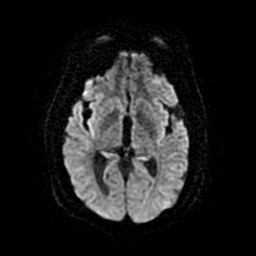
[im 57/100]
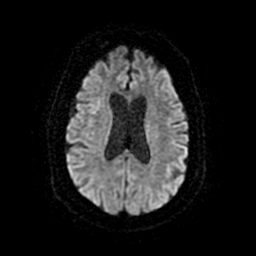
[im 71/100]
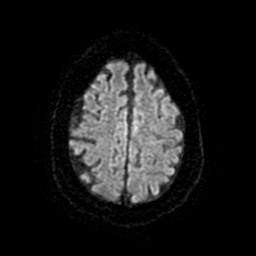
[im 85/100]
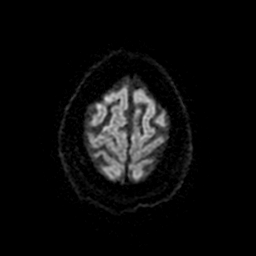
[im 100/100]
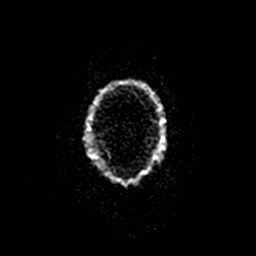

[Series 4: FLAIR · sagittal · 5.0mm · 0.47mm/px · 2 of 23 slices shown (1 of 2)]
[im 1/23]
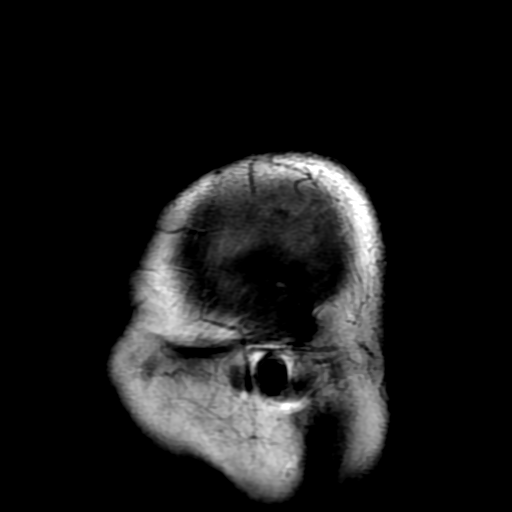
[im 23/23]
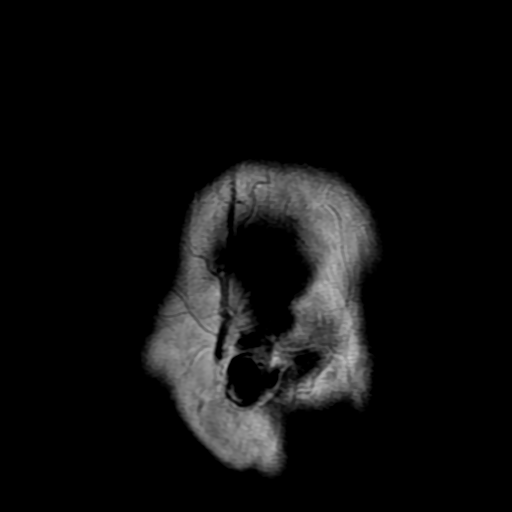

[Series 5: FLAIR · axial · 4.0mm · 0.45mm/px · z∈[-97,+62]mm · 2 of 30 slices shown (2 of 2)]
[im 1/30]
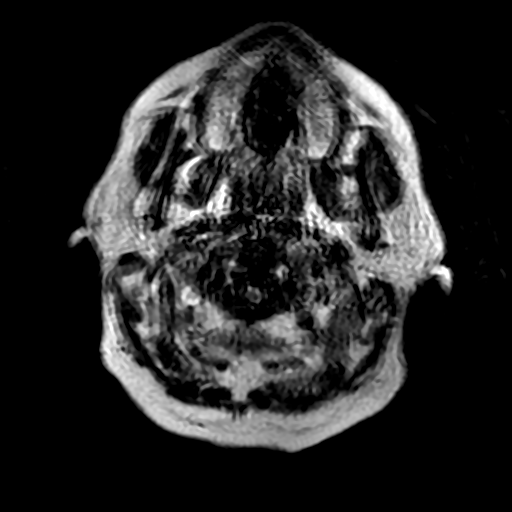
[im 30/30]
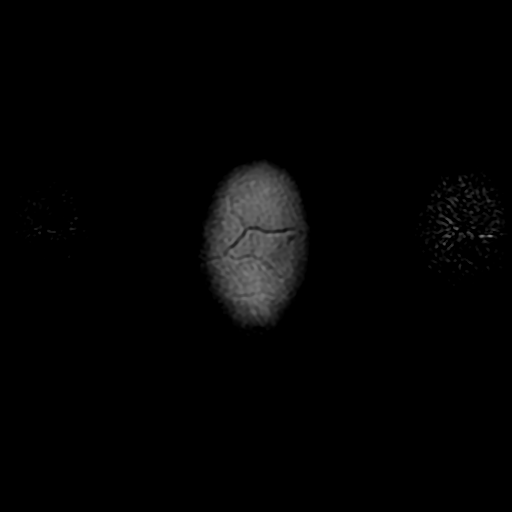

[Series 6: DWI · coronal · 4.0mm · 0.94mm/px · 5 of 72 slices shown (2 of 2)]
[im 1/72]
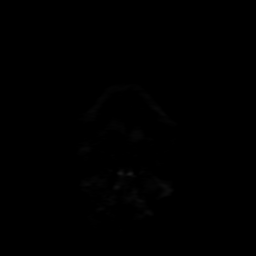
[im 18/72]
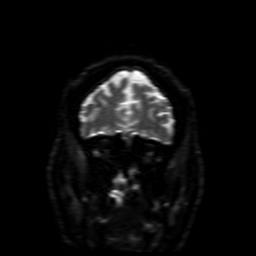
[im 36/72]
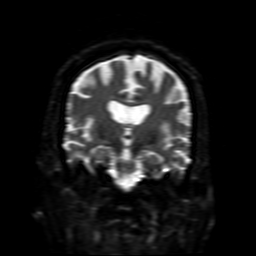
[im 54/72]
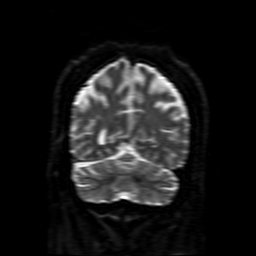
[im 72/72]
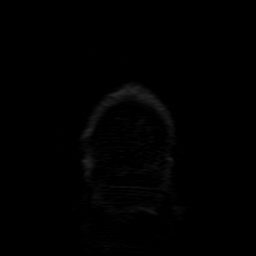

[Series 7: T2 · axial · 4.0mm · 0.45mm/px · z∈[-97,+62]mm · 2 of 30 slices shown (1 of 2)]
[im 1/30]
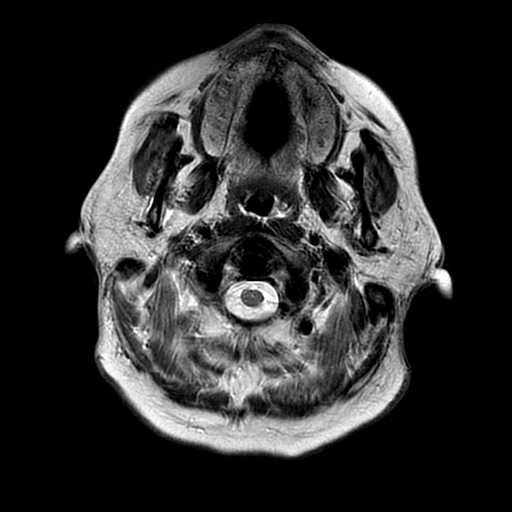
[im 30/30]
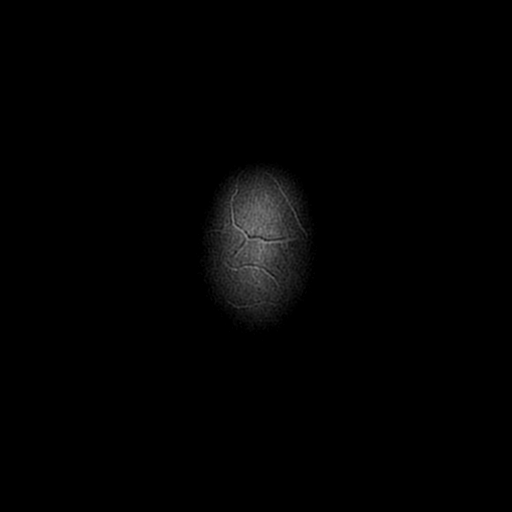

[Series 8: (person_name) · axial · 3.0mm · 0.47mm/px · z∈[-91,+41]mm · 7 of 104 slices shown]
[im 1/104]
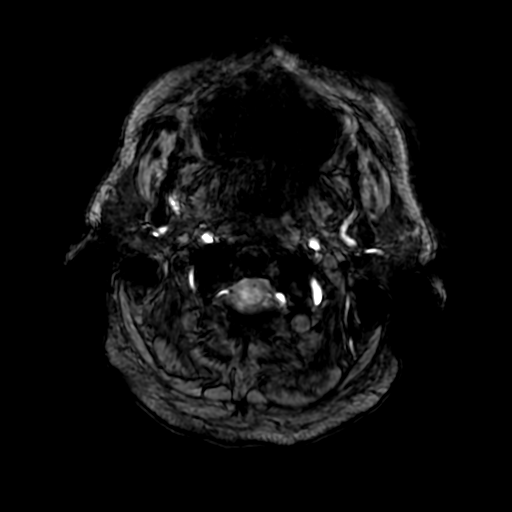
[im 15/104]
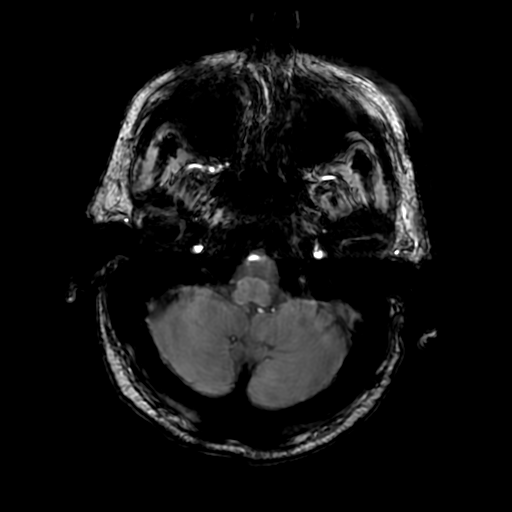
[im 30/104]
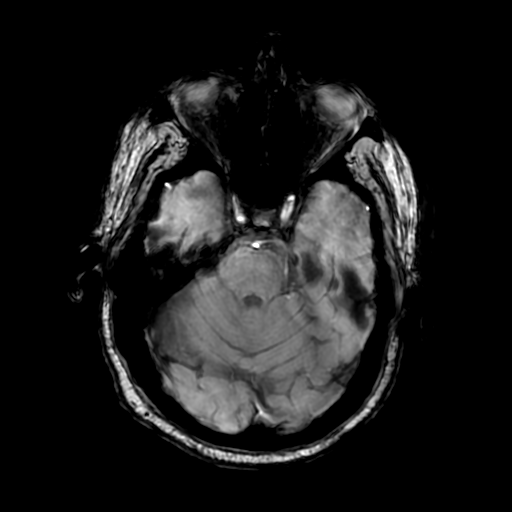
[im 45/104]
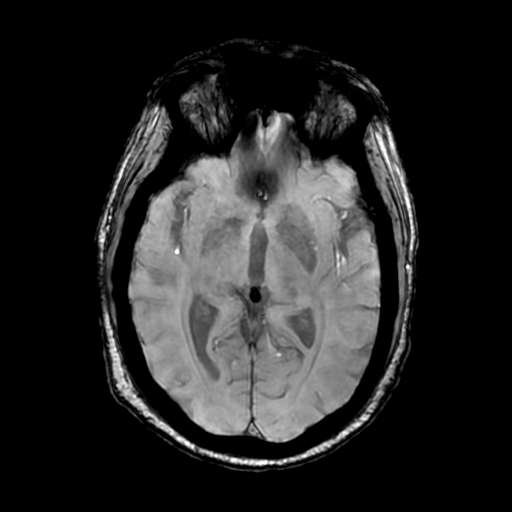
[im 59/104]
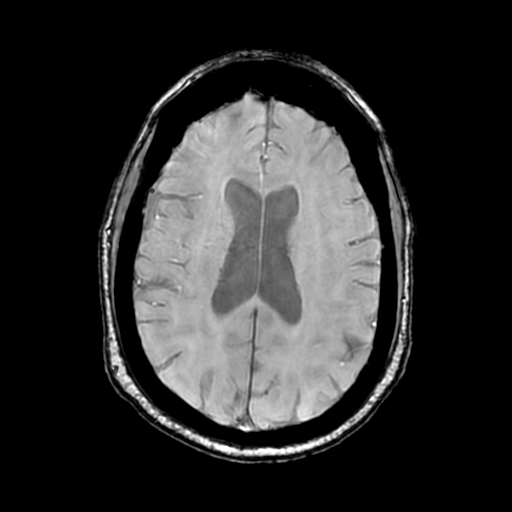
[im 74/104]
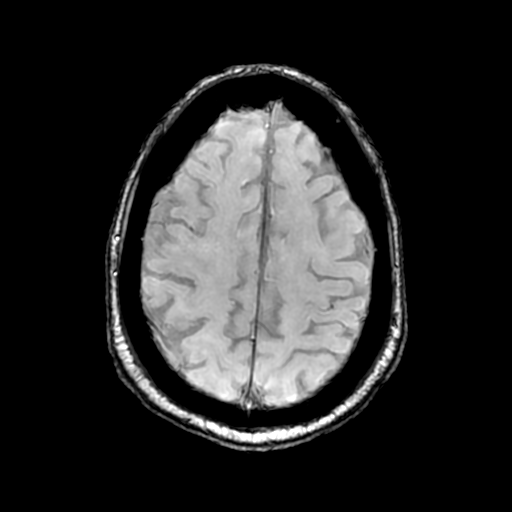
[im 89/104]
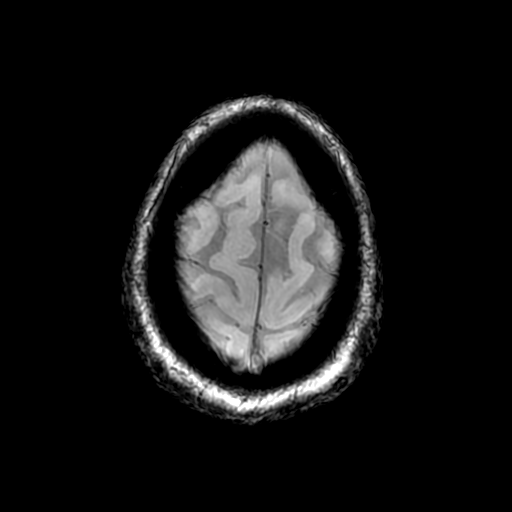

[Series 10: T2 · coronal · 5.0mm · 0.86mm/px · 2 of 31 slices shown (2 of 2)]
[im 1/31]
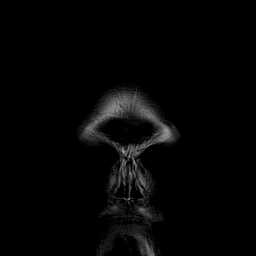
[im 31/31]
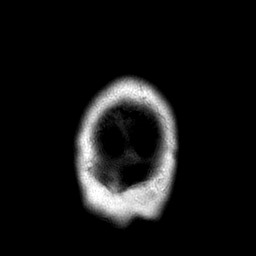

[Series 350: ADC · axial · 3.0mm · 0.94mm/px · z∈[-81,+66]mm · 4 of 50 slices shown (1 of 2)]
[im 1/50]
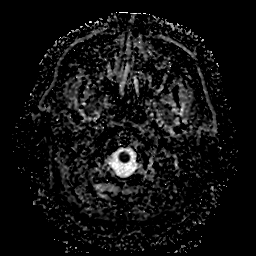
[im 17/50]
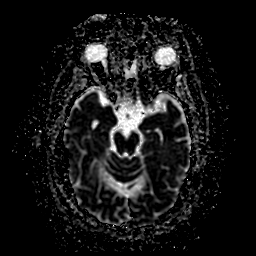
[im 33/50]
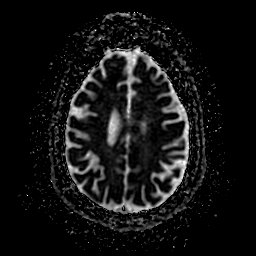
[im 50/50]
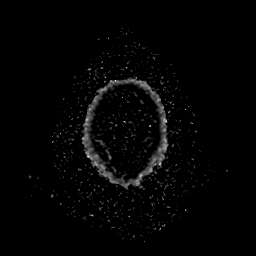

[Series 650: ADC · coronal · 4.0mm · 0.94mm/px · 3 of 36 slices shown (2 of 2)]
[im 1/36]
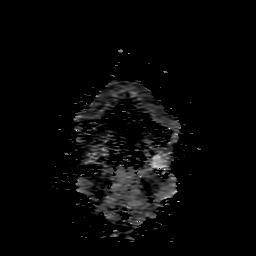
[im 18/36]
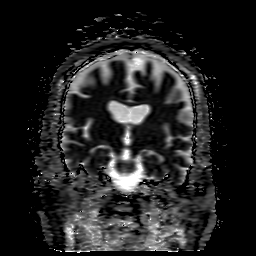
[im 36/36]
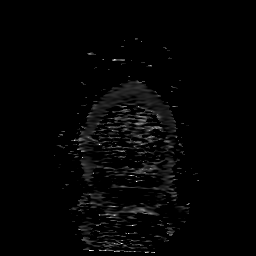

[35 of 48 positions shown; findings below may reference images not displayed]

FINDINGS: The patient was unable to remain motionless for the exam. Small or
subtle lesions could be overlooked.

Brain: No evidence for acute infarction, hemorrhage, mass lesion,
hydrocephalus, or extra-axial fluid. Premature for age atrophy.
Minor subcortical and periventricular T2 and FLAIR hyperintensities,
likely chronic microvascular ischemic change.

Vascular: Normal flow voids.

Skull and upper cervical spine: Normal marrow signal.

Sinuses/Orbits: Negative.

Other: None.
IMPRESSION: Atrophy. No acute intracranial findings. Similar appearance to prior
CT scan.
